# Patient Record
Sex: Male | Born: 1990 | Race: Black or African American | Hispanic: No | Marital: Single | Smoking: Never smoker
Health system: Southern US, Community
[De-identification: ages and names within clinical notes are randomized; demographics above are authoritative.]

## PROBLEM LIST (undated history)

## (undated) DIAGNOSIS — M1990 Unspecified osteoarthritis, unspecified site: Secondary | ICD-10-CM

---
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
## 2017-06-09 ENCOUNTER — Emergency Department (HOSPITAL_COMMUNITY)
Admission: EM | Admit: 2017-06-09 | Discharge: 2017-06-09 | Disposition: A | Discharge status: Home / Self Care | Payer: Medicaid Other | Attending: Emergency Medicine | Admitting: Emergency Medicine

## 2017-06-09 ENCOUNTER — Encounter (HOSPITAL_COMMUNITY): Payer: Self-pay | Admitting: Emergency Medicine

## 2017-06-09 ENCOUNTER — Ambulatory Visit: Payer: Self-pay | Admitting: Physician Assistant

## 2017-06-09 DIAGNOSIS — K0889 Other specified disorders of teeth and supporting structures: Secondary | ICD-10-CM | POA: Insufficient documentation

## 2017-06-09 DIAGNOSIS — Z5321 Procedure and treatment not carried out due to patient leaving prior to being seen by health care provider: Secondary | ICD-10-CM | POA: Insufficient documentation

## 2017-06-09 MED ORDER — IBUPROFEN 400 MG PO TABS
400.0000 mg | ORAL_TABLET | Freq: Once | ORAL | Status: AC | PRN
  Administered 2017-06-09: 400 mg via ORAL

## 2017-06-09 MED ORDER — IBUPROFEN 400 MG PO TABS
ORAL_TABLET | ORAL | Status: AC
  Filled 2017-06-09: qty 1

## 2017-06-09 NOTE — ED Triage Notes (Signed)
Pt presents with dental pain on the L upper jaw with pain that radiates to L face and L ear since yesterday; pt also reports subjective fevers and chills

## 2017-06-09 NOTE — ED Notes (Signed)
Pt approached nurse first stating that he could not wait any longer and would return when he was able to stay longer; pt encouraged to stay but refused; pt ambulatory with steady gait from hospital

## 2017-06-10 ENCOUNTER — Encounter (HOSPITAL_COMMUNITY): Payer: Self-pay | Admitting: Emergency Medicine

## 2017-06-10 ENCOUNTER — Emergency Department (HOSPITAL_COMMUNITY)
Admission: EM | Admit: 2017-06-10 | Discharge: 2017-06-10 | Disposition: A | Discharge status: Home / Self Care | Payer: Self-pay | Attending: Emergency Medicine | Admitting: Emergency Medicine

## 2017-06-10 DIAGNOSIS — H6122 Impacted cerumen, left ear: Secondary | ICD-10-CM | POA: Insufficient documentation

## 2017-06-10 DIAGNOSIS — K0889 Other specified disorders of teeth and supporting structures: Secondary | ICD-10-CM | POA: Insufficient documentation

## 2017-06-10 MED ORDER — HYDROCODONE-ACETAMINOPHEN 5-325 MG PO TABS
1.0000 | ORAL_TABLET | Freq: Once | ORAL | Status: AC
  Administered 2017-06-10: 1 via ORAL
  Filled 2017-06-10: qty 1

## 2017-06-10 MED ORDER — AMOXICILLIN 500 MG PO CAPS: 1000.0000 mg | ORAL_CAPSULE | Freq: Two times a day (BID) | ORAL | 0 refills | Status: DC

## 2017-06-10 MED ORDER — AMOXICILLIN 500 MG PO CAPS
1000.0000 mg | ORAL_CAPSULE | Freq: Once | ORAL | Status: AC
  Administered 2017-06-10: 1000 mg via ORAL
  Filled 2017-06-10: qty 2

## 2017-06-10 MED ORDER — HYDROCODONE-ACETAMINOPHEN 5-325 MG PO TABS: ORAL_TABLET | ORAL | 0 refills | Status: DC

## 2017-06-10 NOTE — ED Notes (Signed)
C/O left upper dental pain and left ear pain x 3 days.

## 2017-06-10 NOTE — ED Triage Notes (Signed)
Pt sts left sided dental pain and ear pain x 3 days

## 2017-06-10 NOTE — ED Provider Notes (Signed)
[facility]-EMERGENCY DEPT Provider Note   CSN: [id number] Arrival date & time: 06/10/17  5465     History   Chief Complaint Chief Complaint  Patient presents with  . Otalgia  . Dental Pain    HPI   Blood pressure 123/76, pulse 68, temperature 98 F (36.7 C), temperature source Oral, resp. rate 18, height 6\' 1"  (1.854 m), weight 99.8 kg (220 lb), SpO2 98 %.  Daniel English is a 26 y.o. male complaining of Left upper dental pain from fractured tooth worsening over the course of last several days not alleviated with acetaminophen. He also reports pain in the left ear, he tried to clean the ear out with Q-tips and full-strength height and peroxide without relief. Denies fever/chills, rhinorrhea, sinus pressure or congestion, difficulty opening jaw, difficulty swallowing, SOB, gum swelling, facial swelling, neck swelling.    History reviewed. No pertinent past medical history.  There are no active problems to display for this patient.   History reviewed. No pertinent surgical history.     Home Medications    Prior to Admission medications   Medication Sig Start Date End Date Taking? Authorizing Provider  amoxicillin (AMOXIL) 500 MG capsule Take 2 capsules (1,000 mg total) by mouth 2 (two) times daily. 06/10/17   Chantele Corado, Joni Reining, PA-C  HYDROcodone-acetaminophen (NORCO/VICODIN) 5-325 MG tablet Take 1-2 tablets by mouth every 6 hours as needed for pain and/or cough. 06/10/17   Hermenegildo Clausen, Mardella Layman    Family History History reviewed. No pertinent family history.  Social History Social History  Substance Use Topics  . Smoking status: Never Smoker  . Smokeless tobacco: Not on file  . Alcohol use No     Comment: social     Allergies   Patient has no known allergies.   Review of Systems Review of Systems  A complete review of systems was obtained and all systems are negative except as noted in the HPI and PMH.    Physical Exam Updated Vital Signs BP 123/76 (BP  Location: Left Arm)   Pulse 68   Temp 98 F (36.7 C) (Oral)   Resp 18   Ht 6\' 1"  (1.854 m)   Wt 99.8 kg (220 lb)   SpO2 98%   BMI 29.03 kg/m   Physical Exam  Constitutional: He is oriented to person, place, and time. He appears well-developed and well-nourished. No distress.  HENT:  Head: Normocephalic.  Mouth/Throat: Oropharynx is clear and moist.  Generally poor dentition, no gingival swelling, erythema or tenderness to palpation. Patient is handling their secretions. There is no tenderness to palpation or firmness underneath tongue bilaterally. No trismus.   Leftsided cerumen impaction, mild irritation to the outer ear canal   Eyes: Pupils are equal, round, and reactive to light. Conjunctivae and EOM are normal.  Neck: Normal range of motion.  Cardiovascular: Normal rate.   Pulmonary/Chest: Effort normal. No stridor.  Abdominal: Soft.  Musculoskeletal: Normal range of motion.  Lymphadenopathy:    He has no cervical adenopathy.  Neurological: He is alert and oriented to person, place, and time.  Psychiatric: He has a normal mood and affect.  Nursing note and vitals reviewed.    ED Treatments / Results  Labs (all labs ordered are listed, but only abnormal results are displayed) Labs Reviewed - No data to display  EKG  EKG Interpretation None       Radiology No results found.  Procedures Procedures (including critical care time)  Medications Ordered in ED Medications  HYDROcodone-acetaminophen (NORCO/VICODIN)  5-325 MG per tablet 1 tablet (1 tablet Oral Given 06/10/17 1057)  amoxicillin (AMOXIL) capsule 1,000 mg (1,000 mg Oral Given 06/10/17 1057)     Initial Impression / Assessment and Plan / ED Course  I have reviewed the triage vital signs and the nursing notes.  Pertinent labs & imaging results that were available during my care of the patient were reviewed by me and considered in my medical decision making (see chart for details).     Vitals:    06/10/17 0926  BP: 123/76  Pulse: 68  Resp: 18  Temp: 98 F (36.7 C)  TempSrc: Oral  SpO2: 98%  Weight: 99.8 kg (220 lb)  Height: 6\' 1"  (1.854 m)    Medications  HYDROcodone-acetaminophen (NORCO/VICODIN) 5-325 MG per tablet 1 tablet (1 tablet Oral Given 06/10/17 1057)  amoxicillin (AMOXIL) capsule 1,000 mg (1,000 mg Oral Given 06/10/17 1057)    Daniel English is 26 y.o. male presenting with dental pain, no overt abscess. Cerumen impaction, RN irrigating.  Did not completely clear impaction, counseled patient on use of D proximal, I doubt this is an otitis media given his lack of fever, rhinorrhea.  Evaluation does not show pathology that would require ongoing emergent intervention or inpatient treatment. Pt is hemodynamically stable and mentating appropriately. Discussed findings and plan with patient/guardian, who agrees with care plan. All questions answered. Return precautions discussed and outpatient follow up given.      Final Clinical Impressions(s) / ED Diagnoses   Final diagnoses:  Pain, dental  Impacted cerumen of left ear    New Prescriptions New Prescriptions   AMOXICILLIN (AMOXIL) 500 MG CAPSULE    Take 2 capsules (1,000 mg total) by mouth 2 (two) times daily.   HYDROCODONE-ACETAMINOPHEN (NORCO/VICODIN) 5-325 MG TABLET    Take 1-2 tablets by mouth every 6 hours as needed for pain and/or cough.     Kaylyn Lim 06/10/17 1200    Jacalyn Lefevre, MD 06/10/17 1536

## 2017-06-10 NOTE — Discharge Instructions (Signed)
Take vicodin for breakthrough pain, do not drink alcohol, drive, care for children or do other critical tasks while taking vicodin.  Use Debrox once a month for 3 days to soften ceruman and prevent future impactions. NEVER use a Q tip in the ear canal

## 2017-06-10 NOTE — ED Notes (Signed)
Tech irrigating left ear with H2O2 and water.

## 2017-07-01 ENCOUNTER — Encounter: Payer: Self-pay | Admitting: Family Medicine

## 2017-07-01 ENCOUNTER — Ambulatory Visit (INDEPENDENT_AMBULATORY_CARE_PROVIDER_SITE_OTHER): Payer: Self-pay | Admitting: Family Medicine

## 2017-07-01 VITALS — BP 110/76 | HR 74 | Temp 98.2°F | Resp 16 | Ht 73.0 in | Wt 229.0 lb

## 2017-07-01 DIAGNOSIS — Z7689 Persons encountering health services in other specified circumstances: Secondary | ICD-10-CM

## 2017-07-01 DIAGNOSIS — Z113 Encounter for screening for infections with a predominantly sexual mode of transmission: Secondary | ICD-10-CM

## 2017-07-01 DIAGNOSIS — Z23 Encounter for immunization: Secondary | ICD-10-CM

## 2017-07-01 DIAGNOSIS — Z0000 Encounter for general adult medical examination without abnormal findings: Secondary | ICD-10-CM

## 2017-07-01 NOTE — Patient Instructions (Signed)
Sexually Transmitted Disease A sexually transmitted disease (STD) is a disease or infection that may be passed (transmitted) from person to person, usually during sexual activity. This may happen by way of saliva, semen, blood, vaginal mucus, or urine. Common STDs include:  Gonorrhea.  Chlamydia.  Syphilis.  HIV and AIDS.  Genital herpes.  Hepatitis B and C.  Trichomonas.  Human papillomavirus (HPV).  Pubic lice.  Scabies.  Mites.  Bacterial vaginosis.  What are the causes? An STD may be caused by bacteria, a virus, or parasites. STDs are often transmitted during sexual activity if one person is infected. However, they may also be transmitted through nonsexual means. STDs may be transmitted after:  Sexual intercourse with an infected person.  Sharing sex toys with an infected person.  Sharing needles with an infected person or using unclean piercing or tattoo needles.  Having intimate contact with the genitals, mouth, or rectal areas of an infected person.  Exposure to infected fluids during birth.  What are the signs or symptoms? Different STDs have different symptoms. Some people may not have any symptoms. If symptoms are present, they may include:  Painful or bloody urination.  Pain in the pelvis, abdomen, vagina, anus, throat, or eyes.  A skin rash, itching, or irritation.  Growths, ulcerations, blisters, or sores in the genital and anal areas.  Abnormal vaginal discharge with or without bad odor.  Penile discharge in men.  Fever.  Pain or bleeding during sexual intercourse.  Swollen glands in the groin area.  Yellow skin and eyes (jaundice). This is seen with hepatitis.  Swollen testicles.  Infertility.  Sores and blisters in the mouth.  How is this diagnosed? To make a diagnosis, your health care provider may:  Take a medical history.  Perform a physical exam.  Take a sample of any discharge to examine.  Swab the throat,  cervix, opening to the penis, rectum, or vagina for testing.  Test a sample of your first morning urine.  Perform blood tests.  Perform a Pap test, if this applies.  Perform a colposcopy.  Perform a laparoscopy.  How is this treated? Treatment depends on the STD. Some STDs may be treated but not cured.  Chlamydia, gonorrhea, trichomonas, and syphilis can be cured with antibiotic medicine.  Genital herpes, hepatitis, and HIV can be treated, but not cured, with prescribed medicines. The medicines lessen symptoms.  Genital warts from HPV can be treated with medicine or by freezing, burning (electrocautery), or surgery. Warts may come back.  HPV cannot be cured with medicine or surgery. However, abnormal areas may be removed from the cervix, vagina, or vulva.  If your diagnosis is confirmed, your recent sexual partners need treatment. This is true even if they are symptom-free or have a negative culture or evaluation. They should not have sex until their health care providers say it is okay.  Your health care provider may test you for infection again 3 months after treatment.  How is this prevented? Take these steps to reduce your risk of getting an STD:  Use latex condoms, dental dams, and water-soluble lubricants during sexual activity. Do not use petroleum jelly or oils.  Avoid having multiple sex partners.  Do not have sex with someone who has other sex partners.  Do not have sex with anyone you do not know or who is at high risk for an STD.  Avoid risky sex practices that can break your skin.  Do not have sex if you have  open sores on your mouth or skin.  Avoid drinking too much alcohol or taking illegal drugs. Alcohol and drugs can affect your judgment and put you in a vulnerable position.  Avoid engaging in oral and anal sex acts.  Get vaccinated for HPV and hepatitis. If you have not received these vaccines in the past, talk to your health care provider about whether  one or both might be right for you.  If you are at risk of being infected with HIV, it is recommended that you take a prescription medicine daily to prevent HIV infection. This is called pre-exposure prophylaxis (PrEP). You are considered at risk if: ? You are a man who has sex with other men (MSM). ? You are a heterosexual man or woman and are sexually active with more than one partner. ? You take drugs by injection. ? You are sexually active with a partner who has HIV.  Talk with your health care provider about whether you are at high risk of being infected with HIV. If you choose to begin PrEP, you should first be tested for HIV. You should then be tested every 3 months for as long as you are taking PrEP.  Contact a health care provider if:  See your health care provider.  Tell your sexual partner(s). They should be tested and treated for any STDs.  Do not have sex until your health care provider says it is okay. Get help right away if: Contact your health care provider right away if:  You have severe abdominal pain.  You are a man and notice swelling or pain in your testicles.  You are a woman and notice swelling or pain in your vagina.  This information is not intended to replace advice given to you by your health care provider. Make sure you discuss any questions you have with your health care provider. Document Released: 12/28/2002 Document Revised: 04/26/2016 Document Reviewed: 04/27/2013 Elsevier Interactive Patient Education  2018 [employer].  Exercising to [employer] Exercising regularly is important. It has many health benefits, such as:  Improving your overall fitness, flexibility, and endurance.  Increasing your bone density.  Helping with weight control.  Decreasing your body fat.  Increasing your muscle strength.  Reducing stress and tension.  Improving your overall health.  In order to become healthy and stay healthy, it is recommended that you do  moderate-intensity and vigorous-intensity exercise. You can tell that you are exercising at a moderate intensity if you have a higher heart rate and faster breathing, but you are still able to hold a conversation. You can tell that you are exercising at a vigorous intensity if you are breathing much harder and faster and cannot hold a conversation while exercising. How often should I exercise? Choose an activity that you enjoy and set realistic goals. Your health care provider can help you to make an activity plan that works for you. Exercise regularly as directed by your health care provider. This may include:  Doing resistance training twice each week, such as: ? Push-ups. ? Sit-ups. ? Lifting weights. ? Using resistance bands.  Doing a given intensity of exercise for a given amount of time. Choose from these options: ? 150 minutes of moderate-intensity exercise every week. ? 75 minutes of vigorous-intensity exercise every week. ? A mix of moderate-intensity and vigorous-intensity exercise every week.  Children, pregnant women, people who are out of shape, people who are overweight, and older adults may need to consult a health care provider for  individual recommendations. If you have any sort of medical condition, be sure to consult your health care provider before starting a new exercise program. What are some exercise ideas? Some moderate-intensity exercise ideas include:  Walking at a rate of 1 mile in 15 minutes.  Biking.  Hiking.  Golfing.  Dancing.  Some vigorous-intensity exercise ideas include:  Walking at a rate of at least 4.5 miles per hour.  Jogging or running at a rate of 5 miles per hour.  Biking at a rate of at least 10 miles per hour.  Lap swimming.  Roller-skating or in-line skating.  Cross-country skiing.  Vigorous competitive sports, such as football, basketball, and soccer.  Jumping rope.  Aerobic dancing.  What are some everyday activities that  can help me to get exercise?  Yard work, such as: ? Pushing a Surveyor, mining. ? Raking and bagging leaves.  Washing and waxing your car.  Pushing a stroller.  Shoveling snow.  Gardening.  Washing windows or floors. How can I be more active in my day-to-day activities?  Use the stairs instead of the elevator.  Take a walk during your lunch break.  If you drive, park your car farther away from work or school.  If you take public transportation, get off one stop early and walk the rest of the way.  Make all of your phone calls while standing up and walking around.  Get up, stretch, and walk around every 30 minutes throughout the day. What guidelines should I follow while exercising?  Do not exercise so much that you hurt yourself, feel dizzy, or get very short of breath.  Consult your health care provider before starting a new exercise program.  Wear comfortable clothes and shoes with good support.  Drink plenty of water while you exercise to prevent dehydration or heat stroke. Body water is lost during exercise and must be replaced.  Work out until you breathe faster and your heart beats faster. This information is not intended to replace advice given to you by your health care provider. Make sure you discuss any questions you have with your health care provider. Document Released: 11/09/2010 Document Revised: 03/14/2016 Document Reviewed: 03/10/2014 Elsevier Interactive Patient Education  [employer].

## 2017-07-01 NOTE — Progress Notes (Signed)
Patient ID: Daniel English, male    DOB: 05/16/1991, 26 y.o.   MRN: [id number]  PCP: Bing NeighborsHarris, Annisha Baar S, FNP  Chief Complaint  Patient presents with  . Establish Care    Subjective:  HPI Daniel English Daniel English is a 26 y.o. male without significant medical history presents today to establish care. He is uninsured and was referred to practice after presenting to the ED for dental pain and caries for which he [facility] antibiotic[facility]. His only complaints today is a request for dental referral for evaluation of dental caries and he requests STD testing. He is sexually active with females and occasionally uses barrier protection. He reports no known STD exposure. Kylian reports 4-7 per days per week and he adheres to low sodium and recently start a reduced "meat diet regimen".  Social History   Social History  . Marital status: Single    Spouse name: N/A  . Number of children: N/A  . Years of education: N/A   Occupational History  . Not on file.   Social History Main Topics  . Smoking status: Never Smoker  . Smokeless tobacco: Never Used  . Alcohol use Yes     Comment: social  . Drug use: No  . Sexual activity: Not on file   Other Topics Concern  . Not on file   Social History Narrative  . No narrative on file    Family History  Problem Relation Age of Onset  . Hypertension Mother    Review of Systems  Constitutional: Negative.   HENT: Positive for dental problem.   Eyes: Negative.   Respiratory: Negative.   Cardiovascular: Negative.   Gastrointestinal: Negative.   Genitourinary: Negative.   Musculoskeletal: Negative.   Allergic/Immunologic: Negative.   Neurological: Negative.   Hematological: Negative.   Psychiatric/Behavioral: Negative.    See HPI  No Known Allergies  Prior to Admission medications   Medication Sig Start Date End Date Taking? Authorizing Provider  amoxicillin (AMOXIL) 500 MG capsule Take 2 capsules  (1,000 mg total) by mouth 2 (two) times daily. Patient not taking: Reported on 07/01/2017 06/10/17   Pisciotta, Joni ReiningNicole, PA-C  HYDROcodone-acetaminophen (NORCO/VICODIN) 5-325 MG tablet Take 1-2 tablets by mouth every 6 hours as needed for pain and/or cough. Patient not taking: Reported on 07/01/2017 06/10/17   Pisciotta, Mardella LaymanNicole, PA-C    Past Medical, Surgical Family and Social History reviewed and updated.    Objective:   Vitals:   07/01/17 0812  BP: 110/76  Pulse: 74  Resp: 16  Temp: 98.2 F (36.8 C)  SpO2: 99%   Wt Readings from Last 3 Encounters:  07/01/17 229 lb (103.9 kg)  06/10/17 220 lb (99.8 kg)  06/09/17 220 lb (99.8 kg)   Physical Exam  Constitutional: He is oriented to person, place, and time. He appears well-developed and well-nourished.  HENT:  Head: Normocephalic and atraumatic.  Right Ear: External ear normal.  Left Ear: External ear normal.  Nose: Nose normal.  Mouth/Throat: Oropharynx is clear and moist.  Eyes: Pupils are equal, round, and reactive to light. Conjunctivae and EOM are normal.  Neck: Normal range of motion. Neck supple. No thyromegaly present.  Cardiovascular: Normal rate, regular rhythm, normal heart sounds and intact distal pulses.   Pulmonary/Chest: Effort normal and breath sounds normal.  Abdominal: Soft. Bowel sounds are normal. He exhibits no distension and no mass. There is no tenderness. There is no rebound and no guarding.  Musculoskeletal: Normal range of  motion.  Lymphadenopathy:    He has no cervical adenopathy.  Neurological: He is alert and oriented to person, place, and time.  Skin: Skin is warm and dry.  Psychiatric: He has a normal mood and affect. His behavior is normal. Judgment and thought content normal.     Assessment & Plan:  1. Encounter to establish care 2. Annual physical exam 3. Routine screening for STI (sexually transmitted infection) 4. Laboratory examination ordered as part of a complete physical examination 5.  Need for influenza vaccination 6. Need for Tdap vaccination   Age appropriate anticipatory guidance provided. Application for orange card and [place] Patient Assistance provided. Once completed and approved for orange card, I will make a referral to the dental clinic.   Orders Placed This Encounter  Procedures  . GC/CT Probe, Amp (Throat)  . C. trachomatis/N. gonorrhoeae RNA  . Flu Vaccine QUAD 36+ mos IM  . Tdap vaccine greater than or equal to 7yo IM  . CBC with Differential  . COMPLETE METABOLIC PANEL WITH GFR  . Lipid panel  . Hemoglobin A1c  . HIV antibody (with reflex)  . HSV(herpes simplex vrs) 1+2 ab-IgG  . RPR    RTC: 12 months CPE or sooner for any acute needs or concerns  Godfrey Pick. Tiburcio Pea, MSN, FNP-C [facility]  [address] [place], [place] [address] [phone]

## 2017-07-02 LAB — COMPLETE METABOLIC PANEL WITH GFR
AG RATIO: 1.1 (calc) (ref 1.0–2.5)
ALBUMIN MSPROF: 4.1 g/dL (ref 3.6–5.1)
ALKALINE PHOSPHATASE (APISO): 77 U/L (ref 40–115)
ALT: 36 U/L (ref 9–46)
AST: 23 U/L (ref 10–40)
BUN / CREAT RATIO: 6 (calc) (ref 6–22)
BUN: 6 mg/dL — ABNORMAL LOW (ref 7–25)
CALCIUM: 9.4 mg/dL (ref 8.6–10.3)
CO2: 21 mmol/L (ref 20–32)
CREATININE: 1.07 mg/dL (ref 0.60–1.35)
Chloride: 103 mmol/L (ref 98–110)
GFR, EST NON AFRICAN AMERICAN: 97 mL/min/{1.73_m2} (ref >=60)
GFR, Est African American: 112 mL/min/{1.73_m2} (ref >=60)
GLOBULIN: 3.6 g/dL (ref 1.9–3.7)
Glucose, Bld: 88 mg/dL (ref 65–99)
Potassium: 4.1 mmol/L (ref 3.5–5.3)
SODIUM: 138 mmol/L (ref 135–146)
Total Bilirubin: 0.7 mg/dL (ref 0.2–1.2)
Total Protein: 7.7 g/dL (ref 6.1–8.1)

## 2017-07-02 LAB — CBC WITH DIFFERENTIAL/PLATELET
BASOS ABS: 40 {cells}/uL (ref 0–200)
Basophils Relative: 1.1 %
EOS PCT: 1.1 %
Eosinophils Absolute: 40 cells/uL (ref 15–500)
HCT: 48.7 % (ref 38.5–50.0)
HEMOGLOBIN: 16.2 g/dL (ref 13.2–17.1)
LYMPHS ABS: 1969 {cells}/uL (ref 850–3900)
MCH: 26.9 pg — ABNORMAL LOW (ref 27.0–33.0)
MCHC: 33.3 g/dL (ref 32.0–36.0)
MCV: 80.9 fL (ref 80.0–100.0)
MONOS PCT: 9.5 %
MPV: 10.4 fL (ref 7.5–12.5)
NEUTROS ABS: 1210 {cells}/uL — AB (ref 1500–7800)
NEUTROS PCT: 33.6 %
Platelets: 300 10*3/uL (ref 140–400)
RBC: 6.02 10*6/uL — AB (ref 4.20–5.80)
RDW: 12.5 % (ref 11.0–15.0)
Total Lymphocyte: 54.7 %
WBC mixed population: 342 cells/uL (ref 200–950)
WBC: 3.6 10*3/uL — AB (ref 3.8–10.8)

## 2017-07-02 LAB — RPR: RPR Ser Ql: NONREACTIVE

## 2017-07-02 LAB — LIPID PANEL
CHOL/HDL RATIO: 4.4 (calc) (ref <5.0)
Cholesterol: 150 mg/dL (ref <200)
HDL: 34 mg/dL — ABNORMAL LOW (ref >40)
LDL Cholesterol (Calc): 101 mg/dL (calc) — ABNORMAL HIGH
NON-HDL CHOLESTEROL (CALC): 116 mg/dL (ref <130)
Triglycerides: 60 mg/dL (ref <150)

## 2017-07-02 LAB — HSV(HERPES SIMPLEX VRS) I + II AB-IGG
HAV 1 IGG,TYPE SPECIFIC AB: 0.95 index — ABNORMAL HIGH
HSV 2 IGG,TYPE SPECIFIC AB: <0.9 index

## 2017-07-02 LAB — HEMOGLOBIN A1C
EAG (MMOL/L): 5.5 (calc)
Hgb A1c MFr Bld: 5.1 % of total Hgb (ref <5.7)
MEAN PLASMA GLUCOSE: 100 (calc)

## 2017-07-02 LAB — HIV ANTIBODY (ROUTINE TESTING W REFLEX): HIV 1&2 Ab, 4th Generation: NONREACTIVE

## 2017-07-03 LAB — C. TRACHOMATIS/N. GONORRHOEAE RNA
C. trachomatis RNA, TMA: NOT DETECTED
N. GONORRHOEAE RNA, TMA: NOT DETECTED

## 2017-07-03 LAB — GC/CHLAMYDIA PROBE, AMP (THROAT)

## 2017-07-09 ENCOUNTER — Encounter: Payer: Self-pay | Admitting: Family Medicine

## 2017-07-09 ENCOUNTER — Ambulatory Visit: Payer: Self-pay | Attending: Internal Medicine

## 2018-12-01 ENCOUNTER — Ambulatory Visit (HOSPITAL_COMMUNITY)
Admission: EM | Admit: 2018-12-01 | Discharge: 2018-12-01 | Disposition: A | Discharge status: Home / Self Care | Payer: Self-pay | Attending: Family Medicine | Admitting: Family Medicine

## 2018-12-01 ENCOUNTER — Other Ambulatory Visit: Payer: Self-pay

## 2018-12-01 ENCOUNTER — Encounter (HOSPITAL_COMMUNITY): Payer: Self-pay

## 2018-12-01 DIAGNOSIS — B349 Viral infection, unspecified: Secondary | ICD-10-CM

## 2018-12-01 MED ORDER — FLUTICASONE PROPIONATE 50 MCG/ACT NA SUSP: 2.0000 | Freq: Every day | NASAL | 0 refills | Status: DC

## 2018-12-01 MED ORDER — IPRATROPIUM BROMIDE 0.06 % NA SOLN: 2.0000 | Freq: Four times a day (QID) | NASAL | 0 refills | Status: DC

## 2018-12-01 NOTE — ED Provider Notes (Signed)
[facility]    CSN: [id number] Arrival date & time: 12/01/18  1539     History   Chief Complaint Chief Complaint  Patient presents with  . stretchy sore    HPI Henrene PastorDiquan O Welp is a 28 y.o. male.   28 year old male comes in for 1 day history of URI symptoms.  Has had rhinorrhea, nasal congestion, throat irritation.  Denies cough.  Denies fever, chills, night sweats.  Denies painful swallowing, trouble breathing, swelling of the throat, tripoding, drooling, trismus.  Denies nausea, vomiting, diarrhea, abdominal pain.  OTC cold medication with mild relief.  States has multiple sick contacts at work.  Never tobacco smoker.  Occasional THC use.     History reviewed. No pertinent past medical history.  There are no active problems to display for this patient.   History reviewed. No pertinent surgical history.     Home Medications    Prior to Admission medications   Medication Sig Start Date End Date Taking? Authorizing Provider  fluticasone (FLONASE) 50 MCG/ACT nasal spray Place 2 sprays into both nostrils daily. 12/01/18   Cathie HoopsYu, Navneet Schmuck V, PA-C  ipratropium (ATROVENT) 0.06 % nasal spray Place 2 sprays into both nostrils 4 (four) times daily. 12/01/18   Belinda FisherYu, Dimitry Holsworth V, PA-C    Family History Family History  Problem Relation Age of Onset  . Hypertension Mother     Social History Social History   Tobacco Use  . Smoking status: Never Smoker  . Smokeless tobacco: Never Used  Substance Use Topics  . Alcohol use: Yes    Comment: social  . Drug use: No     Allergies   Patient has no known allergies.   Review of Systems Review of Systems  Reason unable to perform ROS: See HPI as above.     Physical Exam Triage Vital Signs ED Triage Vitals  Enc Vitals Group     BP 12/01/18 1600 115/67     Pulse Rate 12/01/18 1600 83     Resp 12/01/18 1600 18     Temp 12/01/18 1600 98.7 F (37.1 C)     Temp Source 12/01/18 1600 Oral     SpO2 12/01/18 1600 100 %   Weight 12/01/18 1602 200 lb (90.7 kg)     Height --      Head Circumference --      Peak Flow --      Pain Score --      Pain Loc --      Pain Edu? --      Excl. in [place]? --    No data found.  Updated Vital Signs BP 115/67 (BP Location: Right Arm)   Pulse 83   Temp 98.7 F (37.1 C) (Oral)   Resp 18   Wt 200 lb (90.7 kg)   SpO2 100%   BMI 26.39 kg/m   Physical Exam Constitutional:      General: He is not in acute distress.    Appearance: He is well-developed. He is not toxic-appearing.  HENT:     Head: Normocephalic and atraumatic.     Right Ear: Tympanic membrane, ear canal and external ear normal. Tympanic membrane is not erythematous or bulging.     Left Ear: Tympanic membrane, ear canal and external ear normal. Tympanic membrane is not erythematous or bulging.     Nose: Rhinorrhea present.     Right Sinus: No maxillary sinus tenderness or frontal sinus tenderness.     Left Sinus: No maxillary sinus  tenderness or frontal sinus tenderness.     Mouth/Throat:     Mouth: Mucous membranes are moist.     Pharynx: Oropharynx is clear. Uvula midline.  Eyes:     Conjunctiva/sclera: Conjunctivae normal.     Pupils: Pupils are equal, round, and reactive to light.  Neck:     Musculoskeletal: Normal range of motion and neck supple.  Cardiovascular:     Rate and Rhythm: Normal rate and regular rhythm.     Heart sounds: Normal heart sounds. No murmur. No friction rub. No gallop.   Pulmonary:     Effort: Pulmonary effort is normal.     Breath sounds: Normal breath sounds. No decreased breath sounds, wheezing, rhonchi or rales.  Skin:    General: Skin is warm and dry.  Neurological:     Mental Status: He is alert and oriented to person, place, and time.  Psychiatric:        Behavior: Behavior normal.        Judgment: Judgment normal.      UC Treatments / Results  Labs (all labs ordered are listed, but only abnormal results are displayed) Labs Reviewed - No data to  display  EKG None  Radiology No results found.  Procedures Procedures (including critical care time)  Medications Ordered in UC Medications - No data to display  Initial Impression / Assessment and Plan / UC Course  I have reviewed the triage vital signs and the nursing notes.  Pertinent labs & imaging results that were available during my care of the patient were reviewed by me and considered in my medical decision making (see chart for details).    Discussed with patient history and exam most consistent with viral URI. Symptomatic treatment as needed. Push fluids. Return precautions given.   Final Clinical Impressions(s) / UC Diagnoses   Final diagnoses:  Viral illness    ED Prescriptions    Medication Sig Dispense Auth. Provider   ipratropium (ATROVENT) 0.06 % nasal spray Place 2 sprays into both nostrils 4 (four) times daily. 15 mL Branson Kranz V, PA-C   fluticasone (FLONASE) 50 MCG/ACT nasal spray Place 2 sprays into both nostrils daily. 1 g Threasa Alpha, [place] 12/01/18 1645

## 2018-12-01 NOTE — Discharge Instructions (Signed)
Start flonase, atrovent nasal spray for nasal congestion/drainage. You can use over the counter nasal saline rinse such as neti pot for nasal congestion. Keep hydrated, your urine should be clear to pale yellow in color. Tylenol/motrin for fever and pain. Monitor for any worsening of symptoms, chest pain, shortness of breath, wheezing, swelling of the throat, follow up for reevaluation.  ° °For sore throat/cough try using a honey-based tea. Use 3 teaspoons of honey with juice squeezed from half lemon. Place shaved pieces of ginger into 1/2-1 cup of water and warm over stove top. Then mix the ingredients and repeat every 4 hours as needed. ° °

## 2018-12-01 NOTE — ED Triage Notes (Signed)
Pt cc stretchy throat and runny nose this started last night.

## 2020-02-13 ENCOUNTER — Ambulatory Visit (INDEPENDENT_AMBULATORY_CARE_PROVIDER_SITE_OTHER): Payer: Self-pay

## 2020-02-13 ENCOUNTER — Ambulatory Visit
Admission: EM | Admit: 2020-02-13 | Discharge: 2020-02-13 | Disposition: A | Discharge status: Home / Self Care | Payer: Self-pay | Attending: Emergency Medicine | Admitting: Emergency Medicine

## 2020-02-13 ENCOUNTER — Encounter: Payer: Self-pay | Admitting: Emergency Medicine

## 2020-02-13 ENCOUNTER — Other Ambulatory Visit: Payer: Self-pay

## 2020-02-13 DIAGNOSIS — M25532 Pain in left wrist: Secondary | ICD-10-CM

## 2020-02-13 DIAGNOSIS — S00531A Contusion of lip, initial encounter: Secondary | ICD-10-CM

## 2020-02-13 DIAGNOSIS — W503XXA Accidental bite by another person, initial encounter: Secondary | ICD-10-CM

## 2020-02-13 MED ORDER — AMOXICILLIN-POT CLAVULANATE 875-125 MG PO TABS: 1.0000 | ORAL_TABLET | Freq: Two times a day (BID) | ORAL | 0 refills | Status: DC

## 2020-02-13 NOTE — Discharge Instructions (Addendum)
Take antibiotic twice daily with food.  Recommend RICE: rest, ice, compression, elevation as needed for pain.   Cold therapy (ice packs) can be used to help swelling both after injury and after prolonged use of areas of chronic pain/aches.  For pain: recommend 350 mg-1000 mg of Tylenol (acetaminophen) and/or 200 mg - 800 mg of Advil (ibuprofen, Motrin) every 8 hours as needed.  May alternate between the two throughout the day as they are generally safe to take together.  DO NOT exceed more than 3000 mg of Tylenol or 3200 mg of ibuprofen in a 24 hour period as this could damage your stomach, kidneys, liver, or increase your bleeding risk.
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
## 2020-05-18 ENCOUNTER — Ambulatory Visit
Admission: EM | Admit: 2020-05-18 | Discharge: 2020-05-18 | Disposition: A | Discharge status: Home / Self Care | Payer: Self-pay | Attending: Physician Assistant | Admitting: Physician Assistant

## 2020-05-18 ENCOUNTER — Encounter: Payer: Self-pay | Admitting: Emergency Medicine

## 2020-05-18 ENCOUNTER — Other Ambulatory Visit: Payer: Self-pay

## 2020-05-18 DIAGNOSIS — R197 Diarrhea, unspecified: Secondary | ICD-10-CM

## 2020-05-18 DIAGNOSIS — R112 Nausea with vomiting, unspecified: Secondary | ICD-10-CM

## 2020-05-18 MED ORDER — ONDANSETRON 4 MG PO TBDP: 4.0000 mg | ORAL_TABLET | Freq: Three times a day (TID) | ORAL | 0 refills | Status: DC | PRN

## 2020-05-18 MED ORDER — DICYCLOMINE HCL 20 MG PO TABS: 20.0000 mg | ORAL_TABLET | Freq: Two times a day (BID) | ORAL | 0 refills | Status: DC | PRN

## 2020-05-18 NOTE — ED Notes (Signed)
Patient able to ambulate independently  

## 2020-05-18 NOTE — ED Triage Notes (Signed)
Pt presents to [facility] for assessment of generalized abdominal pain and diarrhea starting yesterday.  States he ate some leftover seafood which may have caused it.  States he had to miss work yesterday for symptoms.

## 2020-05-18 NOTE — ED Provider Notes (Signed)
[facility]    CSN: [id number] Arrival date & time: 05/18/20  1014      History   Chief Complaint Chief Complaint  Patient presents with  . Abdominal Pain  . Diarrhea    HPI Daniel English is a 29 y.o. male.   29 year old male comes in for 2 day history of nausea, vomiting, diarrhea. Has had 10+ episodes of diarrhea. Denies melena, hematochezia. Nausea with 3-4 NBNB vomiting. Denies fever, URI symptoms. Tried to eat food, that caused the diarrhea to be worse.      History reviewed. No pertinent past medical history.  There are no problems to display for this patient.   History reviewed. No pertinent surgical history.     Home Medications    Prior to Admission medications   Medication Sig Start Date End Date Taking? Authorizing Provider  dicyclomine (BENTYL) 20 MG tablet Take 1 tablet (20 mg total) by mouth 2 (two) times daily as needed for spasms. 05/18/20   Cathie Hoops, Cyrilla Durkin V, PA-C  ondansetron (ZOFRAN ODT) 4 MG disintegrating tablet Take 1 tablet (4 mg total) by mouth every 8 (eight) hours as needed for nausea or vomiting. 05/18/20   Cathie Hoops, Molly Maselli V, PA-C  fluticasone (FLONASE) 50 MCG/ACT nasal spray Place 2 sprays into both nostrils daily. 12/01/18 05/18/20  Cathie Hoops, Corrigan Kretschmer V, PA-C  ipratropium (ATROVENT) 0.06 % nasal spray Place 2 sprays into both nostrils 4 (four) times daily. 12/01/18 05/18/20  Belinda Fisher, PA-C    Family History Family History  Problem Relation Age of Onset  . Hypertension Mother     Social History Social History   Tobacco Use  . Smoking status: Never Smoker  . Smokeless tobacco: Never Used  Substance Use Topics  . Alcohol use: Yes    Comment: social  . Drug use: No     Allergies   Patient has no known allergies.   Review of Systems Review of Systems  Reason unable to perform ROS: See HPI as above.     Physical Exam Triage Vital Signs ED Triage Vitals [05/18/20 1020]  Enc Vitals Group     BP (!) 110/64     Pulse Rate 66     Resp  18     Temp 98 F (36.7 C)     Temp Source Oral     SpO2 98 %     Weight      Height      Head Circumference      Peak Flow      Pain Score 5     Pain Loc      Pain Edu?      Excl. in [place]?    No data found.  Updated Vital Signs BP (!) 110/64 (BP Location: Right Arm)   Pulse 66   Temp 98 F (36.7 C) (Oral)   Resp 18   SpO2 98%   Physical Exam Constitutional:      General: He is not in acute distress.    Appearance: Normal appearance. He is not ill-appearing, toxic-appearing or diaphoretic.  HENT:     Head: Normocephalic and atraumatic.  Cardiovascular:     Rate and Rhythm: Normal rate and regular rhythm.  Pulmonary:     Effort: Pulmonary effort is normal. No respiratory distress.     Comments: LCTAB Abdominal:     General: Bowel sounds are normal.     Palpations: Abdomen is soft.     Tenderness: There is no abdominal  tenderness. There is no right CVA tenderness, left CVA tenderness, guarding or rebound.  Musculoskeletal:     Cervical back: Normal range of motion and neck supple.  Skin:    General: Skin is warm and dry.  Neurological:     Mental Status: He is alert and oriented to person, place, and time.      UC Treatments / Results  Labs (all labs ordered are listed, but only abnormal results are displayed) Labs Reviewed - No data to display  EKG   Radiology No results found.  Procedures Procedures (including critical care time)  Medications Ordered in UC Medications - No data to display  Initial Impression / Assessment and Plan / UC Course  I have reviewed the triage vital signs and the nursing notes.  Pertinent labs & imaging results that were available during my care of the patient were reviewed by me and considered in my medical decision making (see chart for details).    Discussed with patient no alarming signs on exam. Zofran for nausea. Bentyl for abdominal cramping. Push fluids. Bland diet, advance as tolerated. Return precautions  given.  Final Clinical Impressions(s) / UC Diagnoses   Final diagnoses:  Nausea vomiting and diarrhea    ED Prescriptions    Medication Sig Dispense Auth. Provider   ondansetron (ZOFRAN ODT) 4 MG disintegrating tablet Take 1 tablet (4 mg total) by mouth every 8 (eight) hours as needed for nausea or vomiting. 20 tablet Orianna Biskup V, PA-C   dicyclomine (BENTYL) 20 MG tablet Take 1 tablet (20 mg total) by mouth 2 (two) times daily as needed for spasms. 20 tablet Belinda Fisher, PA-C     PDMP not reviewed this encounter.   Belinda Fisher, PA-C 05/18/20 1042

## 2020-05-18 NOTE — Discharge Instructions (Signed)
Zofran for nausea and vomiting as needed. Bentyl for abdominal cramping. Keep hydrated, you urine should be clear to pale yellow in color. Bland diet, advance as tolerated. Monitor for any worsening of symptoms, nausea or vomiting not controlled by medication, worsening abdominal pain, fever, go to the emergency department for further evaluation needed.  °

## 2021-03-12 ENCOUNTER — Other Ambulatory Visit: Payer: Self-pay

## 2021-03-12 ENCOUNTER — Other Ambulatory Visit (HOSPITAL_COMMUNITY): Payer: Self-pay

## 2021-03-12 ENCOUNTER — Emergency Department (HOSPITAL_COMMUNITY)
Admission: EM | Admit: 2021-03-12 | Discharge: 2021-03-12 | Disposition: A | Discharge status: Home / Self Care | Payer: 59 | Attending: Emergency Medicine | Admitting: Emergency Medicine

## 2021-03-12 DIAGNOSIS — K0889 Other specified disorders of teeth and supporting structures: Secondary | ICD-10-CM | POA: Diagnosis present

## 2021-03-12 DIAGNOSIS — K047 Periapical abscess without sinus: Secondary | ICD-10-CM | POA: Diagnosis not present

## 2021-03-12 MED ORDER — METRONIDAZOLE 500 MG PO TABS
500.0000 mg | ORAL_TABLET | Freq: Two times a day (BID) | ORAL | 0 refills | Status: DC
  Filled 2021-03-12: qty 14, 7d supply, fill #0

## 2021-03-12 MED ORDER — PENICILLIN V POTASSIUM 500 MG PO TABS
500.0000 mg | ORAL_TABLET | Freq: Once | ORAL | Status: AC
  Administered 2021-03-12: 500 mg via ORAL
  Filled 2021-03-12: qty 1

## 2021-03-12 MED ORDER — PENICILLIN V POTASSIUM 500 MG PO TABS
500.0000 mg | ORAL_TABLET | Freq: Four times a day (QID) | ORAL | 0 refills | Status: AC
  Filled 2021-03-12: qty 28, 7d supply, fill #0

## 2021-03-12 MED ORDER — NAPROXEN 500 MG PO TABS
500.0000 mg | ORAL_TABLET | Freq: Once | ORAL | Status: AC
  Administered 2021-03-12: 500 mg via ORAL
  Filled 2021-03-12: qty 1

## 2021-03-12 MED ORDER — METRONIDAZOLE 500 MG PO TABS
500.0000 mg | ORAL_TABLET | Freq: Once | ORAL | Status: AC
  Administered 2021-03-12: 500 mg via ORAL
  Filled 2021-03-12: qty 1

## 2021-03-12 NOTE — ED Triage Notes (Signed)
Pt c/o R upper dental pain. States on Saturday he began having increased pain that is now radiating to R ear. Scheduled to have wisdom teeth out next month. Denies fevers. Tried ibuprofen and orajel at home with minimal relief.

## 2021-03-12 NOTE — ED Provider Notes (Signed)
[facility]-EMERGENCY DEPT Provider Note   CSN: [id number] Arrival date & time: 03/12/21  0550     History Chief Complaint  Patient presents with  . Dental Pain    Daniel English is a 30 y.o. male.  HPI     30 year old male comes in with chief complaint of dental pain.  He reports that 2 days ago he chipped his tooth.  There was some pain, he applied Orajel and has been taking ibuprofen.  This morning he woke up with swelling to his face.  He denies any fevers or chills but does have pain and feels like there could be underlying infection.  Patient has a Education officer, community.  He denies any recent abscesses.  He does indicate that his extra molars have not been removed, and he suspects that might be contributing  No past medical history on file.  There are no problems to display for this patient.   No past surgical history on file.     Family History  Problem Relation Age of Onset  . Hypertension Mother     Social History   Tobacco Use  . Smoking status: Never Smoker  . Smokeless tobacco: Never Used  Substance Use Topics  . Alcohol use: Yes    Comment: social  . Drug use: No    Home Medications Prior to Admission medications   Medication Sig Start Date End Date Taking? Authorizing Provider  metroNIDAZOLE (FLAGYL) 500 MG tablet Take 1 tablet (500 mg total) by mouth 2 (two) times daily. No alcohol. 03/12/21  Yes Derwood Kaplan, MD  penicillin v potassium (VEETID) 500 MG tablet Take 1 tablet (500 mg total) by mouth 4 (four) times daily for 7 days. 03/12/21 03/19/21 Yes Derwood Kaplan, MD  dicyclomine (BENTYL) 20 MG tablet Take 1 tablet (20 mg total) by mouth 2 (two) times daily as needed for spasms. 05/18/20   Cathie Hoops, Amy V, PA-C  ondansetron (ZOFRAN ODT) 4 MG disintegrating tablet Take 1 tablet (4 mg total) by mouth every 8 (eight) hours as needed for nausea or vomiting. 05/18/20   Cathie Hoops, Amy V, PA-C  fluticasone (FLONASE) 50 MCG/ACT nasal spray Place 2 sprays into  both nostrils daily. 12/01/18 05/18/20  Cathie Hoops, Amy V, PA-C  ipratropium (ATROVENT) 0.06 % nasal spray Place 2 sprays into both nostrils 4 (four) times daily. 12/01/18 05/18/20  Belinda Fisher, PA-C    Allergies    Patient has no known allergies.  Review of Systems   Review of Systems  Constitutional: Positive for activity change. Negative for fever.  HENT: Positive for dental problem and facial swelling. Negative for drooling, hearing loss and trouble swallowing.   Gastrointestinal: Negative for nausea and vomiting.  Allergic/Immunologic: Negative for immunocompromised state.  Neurological: Negative for headaches.    Physical Exam Updated Vital Signs BP 128/88   Pulse 88   Temp 98 F (36.7 C)   Resp 18   Ht 6' (1.829 m)   Wt 95.3 kg   SpO2 100%   BMI 28.48 kg/m   Physical Exam Vitals and nursing note reviewed.  Constitutional:      Appearance: He is well-developed.  HENT:     Head: Atraumatic.     Mouth/Throat:     Comments: Pt has no trismus, stridor or crepitus over the neck. Pt has bilateral tonsillar enlargement w/o exudates. Pharynx is erythematous. Multiple teeth have decay. Gingival tenderness around tooth 1 and 2.   Cardiovascular:     Rate and Rhythm: Normal  rate.  Pulmonary:     Effort: Pulmonary effort is normal.  Musculoskeletal:     Cervical back: Neck supple.  Skin:    General: Skin is warm.  Neurological:     Mental Status: He is alert and oriented to person, place, and time.     ED Results / Procedures / Treatments   Labs (all labs ordered are listed, but only abnormal results are displayed) Labs Reviewed - No data to display  EKG None  Radiology No results found.  Procedures Procedures   Medications Ordered in ED Medications  penicillin v potassium (VEETID) tablet 500 mg (500 mg Oral Given 03/12/21 0751)  metroNIDAZOLE (FLAGYL) tablet 500 mg (500 mg Oral Given 03/12/21 0750)  naproxen (NAPROSYN) tablet 500 mg (500 mg Oral Given 03/12/21 0751)     ED Course  I have reviewed the triage vital signs and the nursing notes.  Pertinent labs & imaging results that were available during my care of the patient were reviewed by me and considered in my medical decision making (see chart for details).    MDM Rules/Calculators/A&P                          DDx includes: - Periapical tooth infection - Dental abscess - Gingivitis - Dental trauma - Pulpitis - Nerve root compression  30 year old comes in a chief complaint of tooth pain.  He has gingival tenderness over tooth 1 and 2.  He has multiple teeth that have Decay/erosion.  There is right-sided facial swelling without any hard signs of deep space infection.  Patient is nontoxic.  Suspect it to be periapical abscess. Will treat with antibiotics with strict ER return precautions that have been discussed with the patient.  He will follow-up with his own dentist or the dentist on-call, whose information has been provided.  Final Clinical Impression(s) / ED Diagnoses Final diagnoses:  Dental infection    Rx / DC Orders ED Discharge Orders         Ordered    penicillin v potassium (VEETID) 500 MG tablet  4 times daily        03/12/21 0815    metroNIDAZOLE (FLAGYL) 500 MG tablet  2 times daily        03/12/21 0815           Derwood Kaplan, MD 03/12/21 [phone]

## 2021-03-12 NOTE — Discharge Instructions (Signed)
We saw you in the ER for the toothache. No evidence of deep infection, no evidence of pus that can be drained out. YOU WILL NEED TO SEE A DENTIST to get appropriate care. Please take the antibiotics and pain meds provided in the interval.  See your Dentist or call one of the dentist on call for a follow up in 5-7 days.

## 2021-03-14 ENCOUNTER — Encounter (HOSPITAL_COMMUNITY): Payer: Self-pay | Admitting: *Deleted

## 2021-03-14 ENCOUNTER — Emergency Department (HOSPITAL_COMMUNITY)
Admission: EM | Admit: 2021-03-14 | Discharge: 2021-03-14 | Disposition: A | Discharge status: Home / Self Care | Payer: 59 | Attending: Emergency Medicine | Admitting: Emergency Medicine

## 2021-03-14 DIAGNOSIS — K0889 Other specified disorders of teeth and supporting structures: Secondary | ICD-10-CM | POA: Insufficient documentation

## 2021-03-14 NOTE — ED Triage Notes (Signed)
Pt was recently seen for dental pain, he is taking his prescribed medication but states he cannot work d/t pain and would like a work note to rest.

## 2021-03-14 NOTE — ED Provider Notes (Signed)
[facility]-EMERGENCY DEPT Provider Note   CSN: [id number] Arrival date & time: 03/14/21  0848     History Chief Complaint  Patient presents with  . needs work note    Daniel English is a 30 y.o. male.  HPI Patient here for work note for dental pain.  He states that he was seen 2 days ago and is scheduled to follow-up with his dentist to have wisdom teeth removed.  He denies any fevers nausea vomiting chills.  He states that he has not been taking very much pain medicine he is has been taking the penicillin prescribed by his dentist.  He states that yesterday while he was at work his pain became severe enough that it prompted him to not go to work today    History reviewed. No pertinent past medical history.  There are no problems to display for this patient.   History reviewed. No pertinent surgical history.     Family History  Problem Relation Age of Onset  . Hypertension Mother     Social History   Tobacco Use  . Smoking status: Never Smoker  . Smokeless tobacco: Never Used  Substance Use Topics  . Alcohol use: Yes    Comment: social  . Drug use: No    Home Medications Prior to Admission medications   Medication Sig Start Date End Date Taking? Authorizing Provider  dicyclomine (BENTYL) 20 MG tablet Take 1 tablet (20 mg total) by mouth 2 (two) times daily as needed for spasms. 05/18/20   Cathie Hoops, Amy V, PA-C  metroNIDAZOLE (FLAGYL) 500 MG tablet Take 1 tablet (500 mg total) by mouth 2 (two) times daily. No alcohol. 03/12/21   Derwood Kaplan, MD  ondansetron (ZOFRAN ODT) 4 MG disintegrating tablet Take 1 tablet (4 mg total) by mouth every 8 (eight) hours as needed for nausea or vomiting. 05/18/20   Cathie Hoops, Amy V, PA-C  penicillin v potassium (VEETID) 500 MG tablet Take 1 tablet (500 mg total) by mouth 4 (four) times daily for 7 days. 03/12/21 03/19/21  Derwood Kaplan, MD  fluticasone (FLONASE) 50 MCG/ACT nasal spray Place 2 sprays into both nostrils  daily. 12/01/18 05/18/20  Cathie Hoops, Amy V, PA-C  ipratropium (ATROVENT) 0.06 % nasal spray Place 2 sprays into both nostrils 4 (four) times daily. 12/01/18 05/18/20  Belinda Fisher, PA-C    Allergies    Patient has no known allergies.  Review of Systems   Review of Systems  Constitutional: Negative for chills and fever.  HENT: Positive for dental problem. Negative for congestion.   Respiratory: Negative for shortness of breath.   Cardiovascular: Negative for chest pain.  Gastrointestinal: Negative for abdominal pain.  Musculoskeletal: Negative for neck pain.    Physical Exam Updated Vital Signs BP 113/63 (BP Location: Right Arm)   Pulse 89   Temp 98.1 F (36.7 C) (Oral)   Resp 18   SpO2 98%   Physical Exam Vitals and nursing note reviewed.  Constitutional:      General: He is not in acute distress.    Appearance: Normal appearance. He is not ill-appearing.  HENT:     Head: Normocephalic and atraumatic.     Mouth/Throat:     Comments: No trismus. No swelling or redness of gumline.  Full range of motion of tongue No sublingual tenderness palpation Eyes:     General: No scleral icterus.       Right eye: No discharge.        Left  eye: No discharge.     Conjunctiva/sclera: Conjunctivae normal.  Pulmonary:     Effort: Pulmonary effort is normal.     Breath sounds: No stridor.  Neurological:     Mental Status: He is alert and oriented to person, place, and time. Mental status is at baseline.     ED Results / Procedures / Treatments   Labs (all labs ordered are listed, but only abnormal results are displayed) Labs Reviewed - No data to display  EKG None  Radiology No results found.  Procedures Procedures   Medications Ordered in ED Medications - No data to display  ED Course  I have reviewed the triage vital signs and the nursing notes.  Pertinent labs & imaging results that were available during my care of the patient were reviewed by me and considered in my medical  decision making (see chart for details).    MDM Rules/Calculators/A&P                          Patient is well-appearing 30 year old male here for dental pain he has been seen previously he is following up with dentist has good follow-up is currently on penicillin.  He has not taken Tylenol or ibuprofen for pain control and we had a discussion about appropriate management of this pain with appropriate doses of Tylenol and ibuprofen.  No trismus no evidence of deep space infection.  Patient given work note.  Final Clinical Impression(s) / ED Diagnoses Final diagnoses:  Pain, dental    Rx / DC Orders ED Discharge Orders    None       Gailen Shelter, [place] 03/14/21 1001    Gwyneth Sprout, MD 03/14/21 1018

## 2021-09-28 ENCOUNTER — Ambulatory Visit (HOSPITAL_COMMUNITY)
Admission: EM | Admit: 2021-09-28 | Discharge: 2021-09-28 | Disposition: A | Discharge status: Home / Self Care | Payer: 59

## 2021-09-28 ENCOUNTER — Encounter (HOSPITAL_COMMUNITY): Payer: Self-pay

## 2021-09-28 ENCOUNTER — Other Ambulatory Visit: Payer: Self-pay

## 2021-09-28 DIAGNOSIS — M25561 Pain in right knee: Secondary | ICD-10-CM

## 2021-09-28 NOTE — Discharge Instructions (Signed)
You can use ibuprofen 800mg  three times a day if needed for pain. You might also try icing it.
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
## 2021-09-28 NOTE — ED Provider Notes (Signed)
[facility]    CSN: [id number] Arrival date & time: 09/28/21  1938      History   Chief Complaint Chief Complaint  Patient presents with   Knee Pain    HPI Daniel English is a 30 y.o. male.  Patient works at the [employer].  Today a piece of equipment hit him in the medial right knee when he was in a squatting position.  Reports mild pain.  Hurts to walk but is able to walk.  Has not done anything to treat his symptoms.   Knee Pain  History reviewed. No pertinent past medical history.  There are no problems to display for this patient.   History reviewed. No pertinent surgical history.     Home Medications    Prior to Admission medications   Medication Sig Start Date End Date Taking? Authorizing Provider  fluticasone (FLONASE) 50 MCG/ACT nasal spray Place 2 sprays into both nostrils daily. 12/01/18 05/18/20  Cathie Hoops, Amy V, PA-C  ipratropium (ATROVENT) 0.06 % nasal spray Place 2 sprays into both nostrils 4 (four) times daily. 12/01/18 05/18/20  Belinda Fisher, PA-C    Family History Family History  Problem Relation Age of Onset   Hypertension Mother     Social History Social History   Tobacco Use   Smoking status: Never   Smokeless tobacco: Never  Substance Use Topics   Alcohol use: Yes    Comment: social   Drug use: No     Allergies   Patient has no known allergies.   Review of Systems Review of Systems   Physical Exam Triage Vital Signs ED Triage Vitals  Enc Vitals Group     BP 09/28/21 1950 123/74     Pulse Rate 09/28/21 1950 87     Resp 09/28/21 1950 18     Temp 09/28/21 1950 98.5 F (36.9 C)     Temp Source 09/28/21 1950 Oral     SpO2 09/28/21 1950 96 %     Weight --      Height --      Head Circumference --      Peak Flow --      Pain Score 09/28/21 1951 5     Pain Loc --      Pain Edu? --      Excl. in [place]? --    No data found.  Updated Vital Signs BP 123/74 (BP Location: Left Arm)   Pulse 87   Temp 98.5 F (36.9 C)  (Oral)   Resp 18   SpO2 96%   Visual Acuity Right Eye Distance:   Left Eye Distance:   Bilateral Distance:    Right Eye Near:   Left Eye Near:    Bilateral Near:     Physical Exam Constitutional:      Appearance: Normal appearance.  Musculoskeletal:     Right knee: Bony tenderness present. No swelling or deformity. Normal range of motion. Tenderness present. No LCL laxity, MCL laxity, ACL laxity or PCL laxity. Normal patellar mobility.  Neurological:     Mental Status: He is alert.     Gait: Gait normal.     UC Treatments / Results  Labs (all labs ordered are listed, but only abnormal results are displayed) Labs Reviewed - No data to display  EKG   Radiology No results found.  Procedures Procedures (including critical care time)  Medications Ordered in UC Medications - No data to display  Initial Impression / Assessment and  Plan / UC Course  I have reviewed the triage vital signs and the nursing notes.  Pertinent labs & imaging results that were available during my care of the patient were reviewed by me and considered in my medical decision making (see chart for details).    Likely soft tissue injury.  Patient to use ice packs and ibuprofen.  Final Clinical Impressions(s) / UC Diagnoses   Final diagnoses:  Acute pain of right knee     Discharge Instructions      You can use ibuprofen 800mg  three times a day if needed for pain. You might also try icing it.    ED Prescriptions   None    PDMP not reviewed this encounter.   , NP 09/28/21 2024

## 2021-10-08 ENCOUNTER — Telehealth (HOSPITAL_COMMUNITY): Payer: Self-pay | Admitting: Emergency Medicine

## 2021-10-08 NOTE — Telephone Encounter (Signed)
Was called to patient access area to speak to patient.  Patient requesting FMLA papers to be filled out.  Reviewed medical record .  Spoke to patient stating the urgent care does not complete FMLA papers.  Patient reports this is the second time.  Asked patient if there was a physician group the work place uses for these issues, stated that is the physician that sees you.  Suggested patient go to employee health and wellness.  Rene Kocher , patient access provided patient with information.

## 2021-12-04 ENCOUNTER — Ambulatory Visit: Payer: 59 | Admitting: Critical Care Medicine

## 2021-12-04 NOTE — Progress Notes (Incomplete)
° °  New Patient Office Visit  Subjective:  Patient ID: LAWERNCE DU, male    DOB: 05/16/1991  Age: 31 y.o. MRN: [id number]  CC: No chief complaint on file.   HPI DEONDRAE DEMPEWOLF presents for  Flu hcv No past medical history on file.  No past surgical history on file.  Family History  Problem Relation Age of Onset   Hypertension Mother     Social History   Socioeconomic History   Marital status: Single    Spouse name: Not on file   Number of children: Not on file   Years of education: Not on file   Highest education level: Not on file  Occupational History   Not on file  Tobacco Use   Smoking status: Never   Smokeless tobacco: Never  Substance and Sexual Activity   Alcohol use: Yes    Comment: social   Drug use: No   Sexual activity: Not on file  Other Topics Concern   Not on file  Social History Narrative   Not on file   Social Determinants of Health   Financial Resource Strain: Not on file  Food Insecurity: Not on file  Transportation Needs: Not on file  Physical Activity: Not on file  Stress: Not on file  Social Connections: Not on file  Intimate Partner Violence: Not on file    ROS Review of Systems  Objective:   Today's Vitals: There were no vitals taken for this visit.  Physical Exam  Assessment & Plan:   Problem List Items Addressed This Visit   None   Outpatient Encounter Medications as of 12/04/2021  Medication Sig   [DISCONTINUED] fluticasone (FLONASE) 50 MCG/ACT nasal spray Place 2 sprays into both nostrils daily.   [DISCONTINUED] ipratropium (ATROVENT) 0.06 % nasal spray Place 2 sprays into both nostrils 4 (four) times daily.   No facility-administered encounter medications on file as of 12/04/2021.    Follow-up: No follow-ups on file.   Asencion Noble, MD

## 2021-12-14 ENCOUNTER — Other Ambulatory Visit: Payer: Self-pay

## 2021-12-14 ENCOUNTER — Ambulatory Visit (HOSPITAL_COMMUNITY)
Admission: EM | Admit: 2021-12-14 | Discharge: 2021-12-14 | Disposition: A | Discharge status: Home / Self Care | Payer: 59

## 2021-12-14 ENCOUNTER — Encounter (HOSPITAL_COMMUNITY): Payer: Self-pay | Admitting: Emergency Medicine

## 2021-12-14 DIAGNOSIS — M25561 Pain in right knee: Secondary | ICD-10-CM

## 2021-12-14 NOTE — ED Triage Notes (Signed)
Pt reports felt right knee have pains like when had an injury back in December.

## 2021-12-14 NOTE — ED Provider Notes (Signed)
[facility]    CSN: [id number] Arrival date & time: 12/14/21  1738      History   Chief Complaint Chief Complaint  Patient presents with   Knee Pain    HPI ZAO MASOOD is a 31 y.o. male.   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  He experienced similar knee pain in December which resolved with ice, rest, and NSAIDS.  Denies swelling, bruising, weakness. Pain is worse with ambulation.    History reviewed. No pertinent past medical history.  There are no problems to display for this patient.   History reviewed. No pertinent surgical history.     Home Medications    Prior to Admission medications   Medication Sig Start Date End Date Taking? Authorizing Provider  fluticasone (FLONASE) 50 MCG/ACT nasal spray Place 2 sprays into both nostrils daily. 12/01/18 05/18/20  Tasia Catchings, Amy V, PA-C  ipratropium (ATROVENT) 0.06 % nasal spray Place 2 sprays into both nostrils 4 (four) times daily. 12/01/18 05/18/20  Ok Edwards, PA-C    Family History Family History  Problem Relation Age of Onset   Hypertension Mother     Social History Social History   Tobacco Use   Smoking status: Never   Smokeless tobacco: Never  Substance Use Topics   Alcohol use: Yes    Comment: social   Drug use: No     Allergies   Patient has no known allergies.   Review of Systems Review of Systems  Constitutional:  Negative for chills and fever.  HENT:  Negative for ear pain and sore throat.   Eyes:  Negative for pain and visual disturbance.  Respiratory:  Negative for cough and shortness of breath.   Cardiovascular:  Negative for chest pain and palpitations.  Gastrointestinal:  Negative for abdominal pain and vomiting.  Genitourinary:  Negative for dysuria and hematuria.  Musculoskeletal:  Positive for arthralgias (right knee pain). Negative for back pain.  Skin:  Negative for color change and rash.  Neurological:   Negative for seizures and syncope.  All other systems reviewed and are negative.   Physical Exam Triage Vital Signs ED Triage Vitals  Enc Vitals Group     BP 12/14/21 1816 113/70     Pulse Rate 12/14/21 1816 74     Resp 12/14/21 1816 18     Temp 12/14/21 1816 98.1 F (36.7 C)     Temp Source 12/14/21 1816 Oral     SpO2 12/14/21 1816 97 %     Weight --      Height --      Head Circumference --      Peak Flow --      Pain Score 12/14/21 1814 8     Pain Loc --      Pain Edu? --      Excl. in [place]? --    No data found.  Updated Vital Signs BP 113/70 (BP Location: Right Arm)    Pulse 74    Temp 98.1 F (36.7 C) (Oral)    Resp 18    SpO2 97%   Visual Acuity Right Eye Distance:   Left Eye Distance:   Bilateral Distance:    Right Eye Near:   Left Eye Near:    Bilateral Near:     Physical Exam Vitals and nursing note reviewed.  Constitutional:      General: He is not in  acute distress.    Appearance: He is well-developed.  HENT:     Head: Normocephalic and atraumatic.  Eyes:     Conjunctiva/sclera: Conjunctivae normal.  Cardiovascular:     Rate and Rhythm: Normal rate and regular rhythm.     Heart sounds: No murmur heard. Pulmonary:     Effort: Pulmonary effort is normal. No respiratory distress.     Breath sounds: Normal breath sounds.  Abdominal:     Palpations: Abdomen is soft.     Tenderness: There is no abdominal tenderness.  Musculoskeletal:        General: No swelling.     Cervical back: Neck supple.     Comments: Right knee joint line tenderness, no bruising or swelling noted.  Normal strength to right lower extremity.   Skin:    General: Skin is warm and dry.     Capillary Refill: Capillary refill takes less than 2 seconds.  Neurological:     Mental Status: He is alert.  Psychiatric:        Mood and Affect: Mood normal.     UC Treatments / Results  Labs (all labs ordered are listed, but only abnormal results are displayed) Labs Reviewed - No  data to display  EKG   Radiology No results found.  Procedures Procedures (including critical care time)  Medications Ordered in UC Medications - No data to display  Initial Impression / Assessment and Plan / UC Course  I have reviewed the triage vital signs and the nursing notes.  Pertinent labs & imaging results that were available during my care of the patient were reviewed by me and considered in my medical decision making (see chart for details).     Recurrent right knee pain.  Supportive care discussed.  Work note given.  Advised follow up with orthopedics if no improvement or sx worsen.  Final Clinical Impressions(s) / UC Diagnoses   Final diagnoses:  Acute pain of right knee     Discharge Instructions      Recommend ice, rest, ibuprofen  If no improvement or worsening pain follow up with Orthopedics Contact your HR at work    ED Prescriptions   None    PDMP not reviewed this encounter.   Ward, Lenise Arena, PA-C 12/14/21 [phone]

## 2021-12-14 NOTE — Discharge Instructions (Addendum)
Recommend ice, rest, ibuprofen  If no improvement or worsening pain follow up with Orthopedics Contact your HR at work

## 2021-12-20 ENCOUNTER — Ambulatory Visit: Payer: Self-pay

## 2021-12-20 ENCOUNTER — Other Ambulatory Visit: Payer: Self-pay

## 2021-12-20 ENCOUNTER — Other Ambulatory Visit: Payer: Self-pay | Admitting: Family Medicine

## 2021-12-20 DIAGNOSIS — M25561 Pain in right knee: Secondary | ICD-10-CM

## 2021-12-29 ENCOUNTER — Encounter (HOSPITAL_COMMUNITY): Payer: Self-pay | Admitting: *Deleted

## 2021-12-29 ENCOUNTER — Other Ambulatory Visit: Payer: Self-pay

## 2021-12-29 ENCOUNTER — Ambulatory Visit (HOSPITAL_COMMUNITY)
Admission: EM | Admit: 2021-12-29 | Discharge: 2021-12-29 | Disposition: A | Discharge status: Home / Self Care | Payer: 59 | Attending: Physician Assistant | Admitting: Physician Assistant

## 2021-12-29 DIAGNOSIS — M25561 Pain in right knee: Secondary | ICD-10-CM

## 2021-12-29 MED ORDER — IBUPROFEN 600 MG PO TABS: 600.0000 mg | ORAL_TABLET | Freq: Four times a day (QID) | ORAL | 0 refills | Status: DC | PRN
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
## 2021-12-29 NOTE — Discharge Instructions (Signed)
Follow up with orthopedics ?Rest, ice, ibuprofen as needed ?

## 2022-11-01 ENCOUNTER — Ambulatory Visit
Admission: EM | Admit: 2022-11-01 | Discharge: 2022-11-01 | Disposition: A | Discharge status: Home / Self Care | Payer: 59 | Attending: Urgent Care | Admitting: Urgent Care

## 2022-11-01 DIAGNOSIS — K0889 Other specified disorders of teeth and supporting structures: Secondary | ICD-10-CM

## 2022-11-01 DIAGNOSIS — K047 Periapical abscess without sinus: Secondary | ICD-10-CM

## 2022-11-01 MED ORDER — NAPROXEN 500 MG PO TABS: 500.0000 mg | ORAL_TABLET | Freq: Two times a day (BID) | ORAL | 0 refills | Status: DC

## 2022-11-01 MED ORDER — AMOXICILLIN-POT CLAVULANATE 875-125 MG PO TABS: 1.0000 | ORAL_TABLET | Freq: Two times a day (BID) | ORAL | 0 refills | Status: DC

## 2022-11-01 NOTE — ED Triage Notes (Signed)
Pt is here dental pain x1 day

## 2022-11-01 NOTE — Discharge Instructions (Addendum)
[facility] in [place] Address: [address], [place], [place] [address] Phone: [phone]  [place] [phone] extension [phone] [address].  Dr. Donn Pierini [phone] [place] [phone] [address] [facility].  [facility] [phone] extension [phone] [address] [place], [place], [address] First come first serve for the first 10 clients.  May do simple extractions only, no wisdom teeth or surgery.  You may try the second for Thursday of the month starting at [place] [facility] [employer] You may call the school to see if they are still helping to provide dental care for emergent cases.

## 2022-11-01 NOTE — ED Provider Notes (Signed)
  [facility]  Note:  This document was prepared using Dragon voice recognition software and may include unintentional dictation errors.  MRN: [id number] DOB: 11-29-1990  Subjective:   Daniel English is a 32 y.o. male presenting for 1 day history of acute onset moderate to severe right-sided upper dental pain.  Patient reports that his tooth cracked.  There was no trauma but has a history of poor dentition.  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  He did contact them again to restart work on his teeth but they advised to come here first.  He is also had some facial pain and slight swelling.  No current facility-administered medications for this encounter.  Current Outpatient Medications:    ibuprofen (ADVIL) 600 MG tablet, Take 1 tablet (600 mg total) by mouth every 6 (six) hours as needed., Disp: 30 tablet, Rfl: 0   No Known Allergies  History reviewed. No pertinent past medical history.   History reviewed. No pertinent surgical history.  Family History  Problem Relation Age of Onset   Hypertension Mother     Social History   Tobacco Use   Smoking status: Never   Smokeless tobacco: Never  Substance Use Topics   Alcohol use: Yes    Comment: social   Drug use: No    ROS   Objective:   Vitals: BP 125/80 (BP Location: Left Arm)   Pulse 82   Temp 98 F (36.7 C) (Oral)   Resp 12   SpO2 98%   Physical Exam Constitutional:      General: He is not in acute distress.    Appearance: Normal appearance. He is well-developed and normal weight. He is not ill-appearing, toxic-appearing or diaphoretic.  HENT:     Head: Normocephalic and atraumatic.      Right Ear: External ear normal.     Left Ear: External ear normal.     Nose: Nose normal.     Mouth/Throat:     Dentition: Abnormal dentition. Dental tenderness present.     Pharynx: Oropharynx is clear.   Eyes:     General: No scleral icterus.        Right eye: No discharge.        Left eye: No discharge.     Extraocular Movements: Extraocular movements intact.  Cardiovascular:     Rate and Rhythm: Normal rate.  Pulmonary:     Effort: Pulmonary effort is normal.  Musculoskeletal:     Cervical back: Normal range of motion.  Neurological:     Mental Status: He is alert and oriented to person, place, and time.  Psychiatric:        Mood and Affect: Mood normal.        Behavior: Behavior normal.        Thought Content: Thought content normal.        Judgment: Judgment normal.     Assessment and Plan :   PDMP not reviewed this encounter.  1. Dental infection   2. Pain, dental     Start Augmentin for dental infection/abscess, use naproxen for pain and inflammation. Emphasized need for dental surgeon consult either with a new practice or his old one. Counseled patient on potential for adverse effects with medications prescribed/recommended today, strict ER and return-to-clinic precautions discussed, patient verbalized understanding.    Jaynee Eagles, PA-C 11/01/22 1054

## 2022-12-12 ENCOUNTER — Encounter (HOSPITAL_COMMUNITY): Payer: Self-pay

## 2022-12-12 ENCOUNTER — Other Ambulatory Visit: Payer: Self-pay

## 2022-12-12 ENCOUNTER — Emergency Department (HOSPITAL_COMMUNITY)
Admission: EM | Admit: 2022-12-12 | Discharge: 2022-12-13 | Disposition: A | Discharge status: Home / Self Care | Payer: 59 | Attending: Emergency Medicine | Admitting: Emergency Medicine

## 2022-12-12 DIAGNOSIS — Z79899 Other long term (current) drug therapy: Secondary | ICD-10-CM | POA: Insufficient documentation

## 2022-12-12 DIAGNOSIS — R519 Headache, unspecified: Secondary | ICD-10-CM | POA: Diagnosis not present

## 2022-12-12 DIAGNOSIS — R112 Nausea with vomiting, unspecified: Secondary | ICD-10-CM | POA: Diagnosis present

## 2022-12-12 HISTORY — DX: Unspecified osteoarthritis, unspecified site: M19.90

## 2022-12-12 LAB — CBC WITH DIFFERENTIAL/PLATELET
Abs Immature Granulocytes: 0.01 10*3/uL (ref 0.00–0.07)
Basophils Absolute: 0 10*3/uL (ref 0.0–0.1)
Basophils Relative: 0 %
Eosinophils Absolute: 0 10*3/uL (ref 0.0–0.5)
Eosinophils Relative: 0 %
HCT: 50.2 % (ref 39.0–52.0)
Hemoglobin: 16.5 g/dL (ref 13.0–17.0)
Immature Granulocytes: 0 %
Lymphocytes Relative: 20 %
Lymphs Abs: 1.4 10*3/uL (ref 0.7–4.0)
MCH: 27.7 pg (ref 26.0–34.0)
MCHC: 32.9 g/dL (ref 30.0–36.0)
MCV: 84.4 fL (ref 80.0–100.0)
Monocytes Absolute: 0.4 10*3/uL (ref 0.1–1.0)
Monocytes Relative: 6 %
Neutro Abs: 4.9 10*3/uL (ref 1.7–7.7)
Neutrophils Relative %: 74 %
Platelets: 254 10*3/uL (ref 150–400)
RBC: 5.95 MIL/uL — ABNORMAL HIGH (ref 4.22–5.81)
RDW: 12.5 % (ref 11.5–15.5)
WBC: 6.8 10*3/uL (ref 4.0–10.5)
nRBC: 0 % (ref 0.0–0.2)

## 2022-12-12 LAB — COMPREHENSIVE METABOLIC PANEL
ALT: 34 U/L (ref 0–44)
AST: 28 U/L (ref 15–41)
Albumin: 4.2 g/dL (ref 3.5–5.0)
Alkaline Phosphatase: 46 U/L (ref 38–126)
Anion gap: 7 (ref 5–15)
BUN: 6 mg/dL (ref 6–20)
CO2: 28 mmol/L (ref 22–32)
Calcium: 9.4 mg/dL (ref 8.9–10.3)
Chloride: 101 mmol/L (ref 98–111)
Creatinine, Ser: 0.96 mg/dL (ref 0.61–1.24)
GFR, Estimated: >60 mL/min (ref >60)
Glucose, Bld: 93 mg/dL (ref 70–99)
Potassium: 3.5 mmol/L (ref 3.5–5.1)
Sodium: 136 mmol/L (ref 135–145)
Total Bilirubin: 1.1 mg/dL (ref 0.3–1.2)
Total Protein: 7.9 g/dL (ref 6.5–8.1)

## 2022-12-12 LAB — RAPID URINE DRUG SCREEN, HOSP PERFORMED
Amphetamines: NOT DETECTED
Barbiturates: NOT DETECTED
Benzodiazepines: NOT DETECTED
Cocaine: NOT DETECTED
Opiates: NOT DETECTED
Tetrahydrocannabinol: POSITIVE — AB

## 2022-12-12 LAB — URINALYSIS, ROUTINE W REFLEX MICROSCOPIC
Bilirubin Urine: NEGATIVE
Glucose, UA: NEGATIVE mg/dL
Hgb urine dipstick: NEGATIVE
Ketones, ur: NEGATIVE mg/dL
Leukocytes,Ua: NEGATIVE
Nitrite: NEGATIVE
Protein, ur: NEGATIVE mg/dL
Specific Gravity, Urine: 1.003 — ABNORMAL LOW (ref 1.005–1.030)
pH: 7 (ref 5.0–8.0)

## 2022-12-12 LAB — LIPASE, BLOOD: Lipase: 39 U/L (ref 11–51)

## 2022-12-12 NOTE — ED Triage Notes (Signed)
Pt arrived POV for mid abd pain 2 hours after eating [employer], pizza, and fries. Pt reports his "food didn't digest as good" Pt reports vomited after eating. No hx of GERD. Reports 3 BM earlier, soft, denies diarrhea.

## 2022-12-12 NOTE — ED Provider Triage Note (Signed)
Emergency Medicine Provider Triage Evaluation Note  Daniel English , a 32 y.o. male  was evaluated in triage.  Pt complains of abdominal pain, nausea, vomiting.  Denies fever.  Pain is around the umbilicus.  States he has history of marijuana use but not recently.  Also endorses being sexually active recently with genital rash.  No penile discharge.  Review of Systems  Positive: As above Negative: As above  Physical Exam  BP 123/68   Pulse 69   Temp 98 F (36.7 C)   Resp 16   Ht 6' (1.829 m)   Wt 95.3 kg   SpO2 100%   BMI 28.48 kg/m  Gen:   Awake, no distress   Resp:  Normal effort  MSK:   Moves extremities without difficulty  Other:    Medical Decision Making  Medically screening exam initiated at 9:37 PM.  Appropriate orders placed.  Daniel English was informed that the remainder of the evaluation will be completed by another provider, this initial triage assessment does not replace that evaluation, and the importance of remaining in the ED until their evaluation is complete.     Evlyn Courier, PA-C 12/12/22 2137

## 2022-12-12 NOTE — ED Provider Notes (Signed)
[facility] Provider Note   CSN: [id number] Arrival date & time: 12/12/22  2112     History  Chief Complaint  Patient presents with   Abd Pain  N/V    Daniel English is a 32 y.o. male.  The history is provided by the patient.  Daniel English is a 32 y.o. male who presents to the Emergency Department complaining of nausea and vomiting.  He states that around 3 PM he started feeling unwell after eating and then had an episode of nausea and vomiting with a mild headache.  Overall his headache is resolving but he does have some ongoing nausea.  No diarrhea but did have 3-4 BMs today that is unusual for him.  No fevers, abdominal pain.  No satiated cough or sore throat.  Has no known medical problems takes no medications and has no prior surgeries.        Home Medications Prior to Admission medications   Medication Sig Start Date End Date Taking? Authorizing Provider  ibuprofen (ADVIL) 200 MG tablet Take 200 mg by mouth every 6 (six) hours as needed for mild pain.   Yes [provider]  ondansetron (ZOFRAN-ODT) 4 MG disintegrating tablet Take 1 tablet (4 mg total) by mouth every 8 (eight) hours as needed for nausea or vomiting. 12/13/22  Yes Quintella Reichert, MD  amoxicillin-clavulanate (AUGMENTIN) 875-125 MG tablet Take 1 tablet by mouth 2 (two) times daily. Patient not taking: Reported on 12/12/2022 11/01/22   Jaynee Eagles, PA-C  ibuprofen (ADVIL) 600 MG tablet Take 1 tablet (600 mg total) by mouth every 6 (six) hours as needed. Patient not taking: Reported on 12/12/2022 12/29/21   Ward, Lenise Arena, PA-C  naproxen (NAPROSYN) 500 MG tablet Take 1 tablet (500 mg total) by mouth 2 (two) times daily with a meal. Patient not taking: Reported on 12/12/2022 11/01/22   Jaynee Eagles, PA-C  fluticasone [facility]) 50 MCG/ACT nasal spray Place 2 sprays into both nostrils daily. 12/01/18 05/18/20  Tasia Catchings, Amy V, PA-C  ipratropium (ATROVENT) 0.06 % nasal  spray Place 2 sprays into both nostrils 4 (four) times daily. 12/01/18 05/18/20  Ok Edwards, PA-C      Allergies    Patient has no known allergies.    Review of Systems   Review of Systems  All other systems reviewed and are negative.   Physical Exam Updated Vital Signs BP 126/75 (BP Location: Right Arm)   Pulse (!) 55   Temp 98 F (36.7 C) (Oral)   Resp 16   Ht 6' (1.829 m)   Wt 95.3 kg   SpO2 100%   BMI 28.48 kg/m  Physical Exam Vitals and nursing note reviewed.  Constitutional:      Appearance: He is well-developed.  HENT:     Head: Normocephalic and atraumatic.  Cardiovascular:     Rate and Rhythm: Normal rate and regular rhythm.  Pulmonary:     Effort: Pulmonary effort is normal. No respiratory distress.  Abdominal:     Palpations: Abdomen is soft.     Tenderness: There is no abdominal tenderness. There is no guarding or rebound.  Musculoskeletal:        General: No tenderness.  Skin:    General: Skin is warm and dry.  Neurological:     Mental Status: He is alert and oriented to person, place, and time.     Comments: 5 out of 5 strength in all 4 extremities  Psychiatric:  Behavior: Behavior normal.     ED Results / Procedures / Treatments   Labs (all labs ordered are listed, but only abnormal results are displayed) Labs Reviewed  CBC WITH DIFFERENTIAL/PLATELET - Abnormal; Notable for the following components:      Result Value   RBC 5.95 (*)    All other components within normal limits  URINALYSIS, ROUTINE W REFLEX MICROSCOPIC - Abnormal; Notable for the following components:   Color, Urine STRAW (*)    Specific Gravity, Urine 1.003 (*)    All other components within normal limits  RAPID URINE DRUG SCREEN, HOSP PERFORMED - Abnormal; Notable for the following components:   Tetrahydrocannabinol POSITIVE (*)    All other components within normal limits  COMPREHENSIVE METABOLIC PANEL  LIPASE, BLOOD    EKG None  Radiology No results  found.  Procedures Procedures    Medications Ordered in ED Medications  ondansetron (ZOFRAN-ODT) disintegrating tablet 4 mg (4 mg Oral Given 12/13/22 0010)    ED Course/ Medical Decision Making/ A&P                             Medical Decision Making Amount and/or Complexity of Data Reviewed Labs: ordered.  Risk Prescription drug management.   Patient here for evaluation of nausea and vomiting that started earlier today.  His vomiting has resolved but he does have some ongoing nausea.  He is nontoxic-appearing on evaluation with no focal neurologic deficits.  He has no abdominal tenderness.  CBC, CMP, lipase without acute significant abnormality.  Current clinical picture is not consistent with SBO, pancreatitis, perforated viscus, cholecystitis, appendicitis.  Patient treated with ondansetron in the emergency department.  Discussed with patient home care for nausea and vomiting.  Discussed outpatient follow-up and return precautions.       Final Clinical Impression(s) / ED Diagnoses Final diagnoses:  Nausea and vomiting, unspecified vomiting type    Rx / DC Orders ED Discharge Orders          Ordered    ondansetron (ZOFRAN-ODT) 4 MG disintegrating tablet  Every 8 hours PRN        12/13/22 0004              Quintella Reichert, MD 12/13/22 [phone]

## 2022-12-13 MED ORDER — ONDANSETRON 4 MG PO TBDP
4.0000 mg | ORAL_TABLET | Freq: Once | ORAL | Status: AC
  Administered 2022-12-13: 4 mg via ORAL
  Filled 2022-12-13: qty 1

## 2022-12-13 MED ORDER — ONDANSETRON 4 MG PO TBDP: 4.0000 mg | ORAL_TABLET | Freq: Three times a day (TID) | ORAL | 0 refills | Status: DC | PRN

## 2023-04-14 ENCOUNTER — Ambulatory Visit (HOSPITAL_COMMUNITY)
Admission: EM | Admit: 2023-04-14 | Discharge: 2023-04-14 | Disposition: A | Discharge status: Home / Self Care | Payer: 59 | Attending: Emergency Medicine | Admitting: Emergency Medicine

## 2023-04-14 ENCOUNTER — Encounter (HOSPITAL_COMMUNITY): Payer: Self-pay

## 2023-04-14 DIAGNOSIS — A059 Bacterial foodborne intoxication, unspecified: Secondary | ICD-10-CM

## 2023-04-14 MED ORDER — ONDANSETRON 4 MG PO TBDP: 4.0000 mg | ORAL_TABLET | Freq: Four times a day (QID) | ORAL | 0 refills | Status: AC | PRN

## 2023-04-14 MED ORDER — ONDANSETRON 4 MG PO TBDP
4.0000 mg | ORAL_TABLET | Freq: Once | ORAL | Status: AC
  Administered 2023-04-14: 4 mg via ORAL

## 2023-04-14 MED ORDER — ONDANSETRON 4 MG PO TBDP
ORAL_TABLET | ORAL | Status: AC
  Filled 2023-04-14: qty 1

## 2023-04-14 NOTE — ED Triage Notes (Signed)
Pt is here for vomiting and headache after eating [employer] yesterday. Denies any vomiting today.

## 2023-04-14 NOTE — Discharge Instructions (Addendum)
The nausea medicine (zofran) can be used every 6 hours as needed. Tablet dissolves under the tongue.  Make sure you are increasing fluid intake.  It's ok if you don't have appetite, as long as you are replacing with fluids! Water is best but Pedialyte is good too.  If you are taking the nausea medicine and still vomiting, you need to go to the emergency department.

## 2023-04-14 NOTE — ED Provider Notes (Signed)
[facility]    CSN: [id number] Arrival date & time: 04/14/23  1148     History   Chief Complaint Chief Complaint  Patient presents with   Emesis    HPI Daniel English is a 32 y.o. male.  2 episodes of vomiting about 10 minutes after eating at [facility] last night. Several episodes of loose stool. One further episode of emesis early this morning. Able to sip water since then. Emesis was non bloody, non bilious.  No fever. Denies abdominal pain or cramping  No one he ate with has symptoms, may have had different foods. No recent travel  Symptoms overall improving. Needs note for work.  Past Medical History:  Diagnosis Date   Arthritis     There are no problems to display for this patient.   History reviewed. No pertinent surgical history.     Home Medications    Prior to Admission medications   Medication Sig Start Date End Date Taking? Authorizing Provider  ondansetron (ZOFRAN-ODT) 4 MG disintegrating tablet Take 1 tablet (4 mg total) by mouth every 6 (six) hours as needed for nausea or vomiting. 04/14/23  Yes Delainey Winstanley, PA-C  fluticasone (FLONASE) 50 MCG/ACT nasal spray Place 2 sprays into both nostrils daily. 12/01/18 05/18/20  Cathie Hoops, Amy V, PA-C  ipratropium (ATROVENT) 0.06 % nasal spray Place 2 sprays into both nostrils 4 (four) times daily. 12/01/18 05/18/20  Belinda Fisher, PA-C    Family History Family History  Problem Relation Age of Onset   Hypertension Mother     Social History Social History   Tobacco Use   Smoking status: Never   Smokeless tobacco: Never  Vaping Use   Vaping Use: Never used  Substance Use Topics   Alcohol use: Yes    Comment: social   Drug use: Not Currently     Allergies   Patient has no known allergies.   Review of Systems Review of Systems As per HPI  Physical Exam Triage Vital Signs ED Triage Vitals  Enc Vitals Group     BP 04/14/23 1354 123/72     Pulse Rate 04/14/23 1354 (!) 2     Resp 04/14/23 1354  18     Temp 04/14/23 1354 (!) 97.5 F (36.4 C)     Temp Source 04/14/23 1354 Oral     SpO2 04/14/23 1354 98 %     Weight --      Height --      Head Circumference --      Peak Flow --      Pain Score 04/14/23 1356 4     Pain Loc --      Pain Edu? --      Excl. in GC? --    No data found.  Updated Vital Signs BP 123/72 (BP Location: Left Arm)   Pulse 72   Temp (!) 97.5 F (36.4 C) (Oral)   Resp 18   SpO2 98%   Physical Exam Vitals and nursing note reviewed.  Constitutional:      Appearance: Normal appearance.  HENT:     Mouth/Throat:     Mouth: Mucous membranes are moist.     Pharynx: Oropharynx is clear.  Eyes:     Conjunctiva/sclera: Conjunctivae normal.  Cardiovascular:     Rate and Rhythm: Normal rate and regular rhythm.     Heart sounds: Normal heart sounds.  Pulmonary:     Effort: Pulmonary effort is normal. No respiratory distress.  Breath sounds: Normal breath sounds.  Abdominal:     General: Bowel sounds are normal.     Palpations: Abdomen is soft.     Tenderness: There is abdominal tenderness. There is no guarding or rebound.     Comments: Mild generalized tenderness throughout   Musculoskeletal:        General: Normal range of motion.  Skin:    General: Skin is warm and dry.  Neurological:     Mental Status: He is alert and oriented to person, place, and time.     UC Treatments / Results  Labs (all labs ordered are listed, but only abnormal results are displayed) Labs Reviewed - No data to display  EKG  Radiology No results found.  Procedures Procedures (including critical care time)  Medications Ordered in UC Medications  ondansetron (ZOFRAN-ODT) disintegrating tablet 4 mg (4 mg Oral Given 04/14/23 1440)    Initial Impression / Assessment and Plan / UC Course  I have reviewed the triage vital signs and the nursing notes.  Pertinent labs & imaging results that were available during my care of the patient were reviewed by me and  considered in my medical decision making (see chart for details).  Zofran ODT given in clinic. Able to sip water No red flags. He is improving.  Discussed pushing fluids, bland diet as tolerated, zofran q6 hours prn. Work note provided. Return precautions discussed. Patient agrees to plan  Final Clinical Impressions(s) / UC Diagnoses   Final diagnoses:  Foodborne gastroenteritis     Discharge Instructions      The nausea medicine (zofran) can be used every 6 hours as needed. Tablet dissolves under the tongue.  Make sure you are increasing fluid intake.  It's ok if you don't have appetite, as long as you are replacing with fluids! Water is best but Pedialyte is good too.  If you are taking the nausea medicine and still vomiting, you need to go to the emergency department.     ED Prescriptions     Medication Sig Dispense Auth. Provider   ondansetron (ZOFRAN-ODT) 4 MG disintegrating tablet Take 1 tablet (4 mg total) by mouth every 6 (six) hours as needed for nausea or vomiting. 20 tablet Nycole Kawahara, Lurena Joiner, PA-C      PDMP not reviewed this encounter.   Marlow Baars, [place] 04/14/23 1446

## 2023-06-03 IMAGING — DX DG KNEE COMPLETE 4+V*R*
4 series · 4 of 4 positions shown · non-contrast
Comparison: None.

CLINICAL DATA: 30-year-old male status post twisting injury in
[REDACTED] with persistent pain.

EXAM:
RIGHT KNEE - COMPLETE 4+ VIEW

[knee ap]
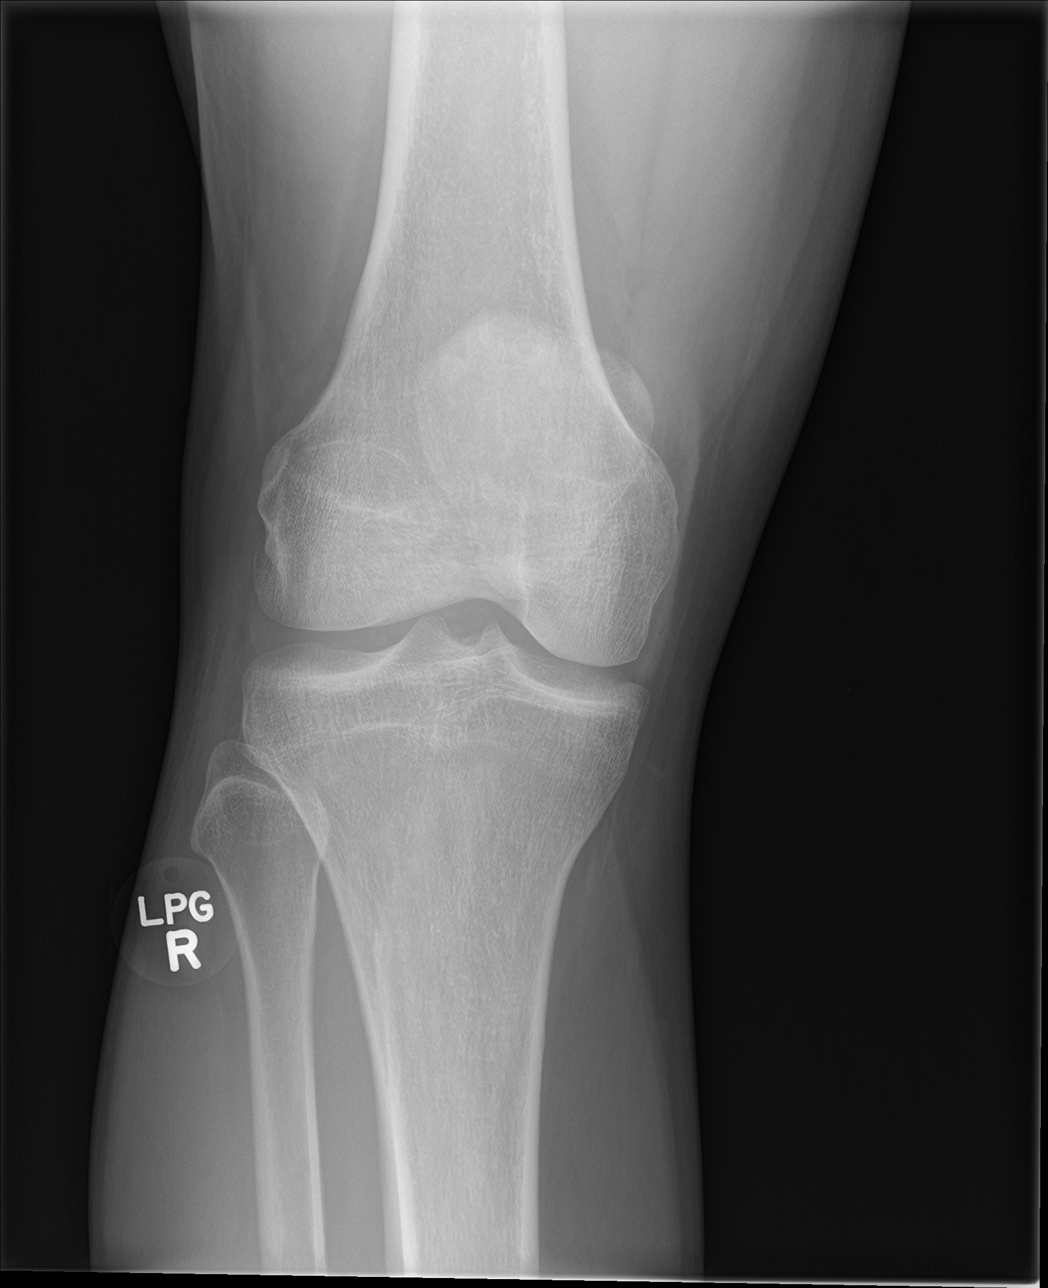

[knee obl (1 of 2)]
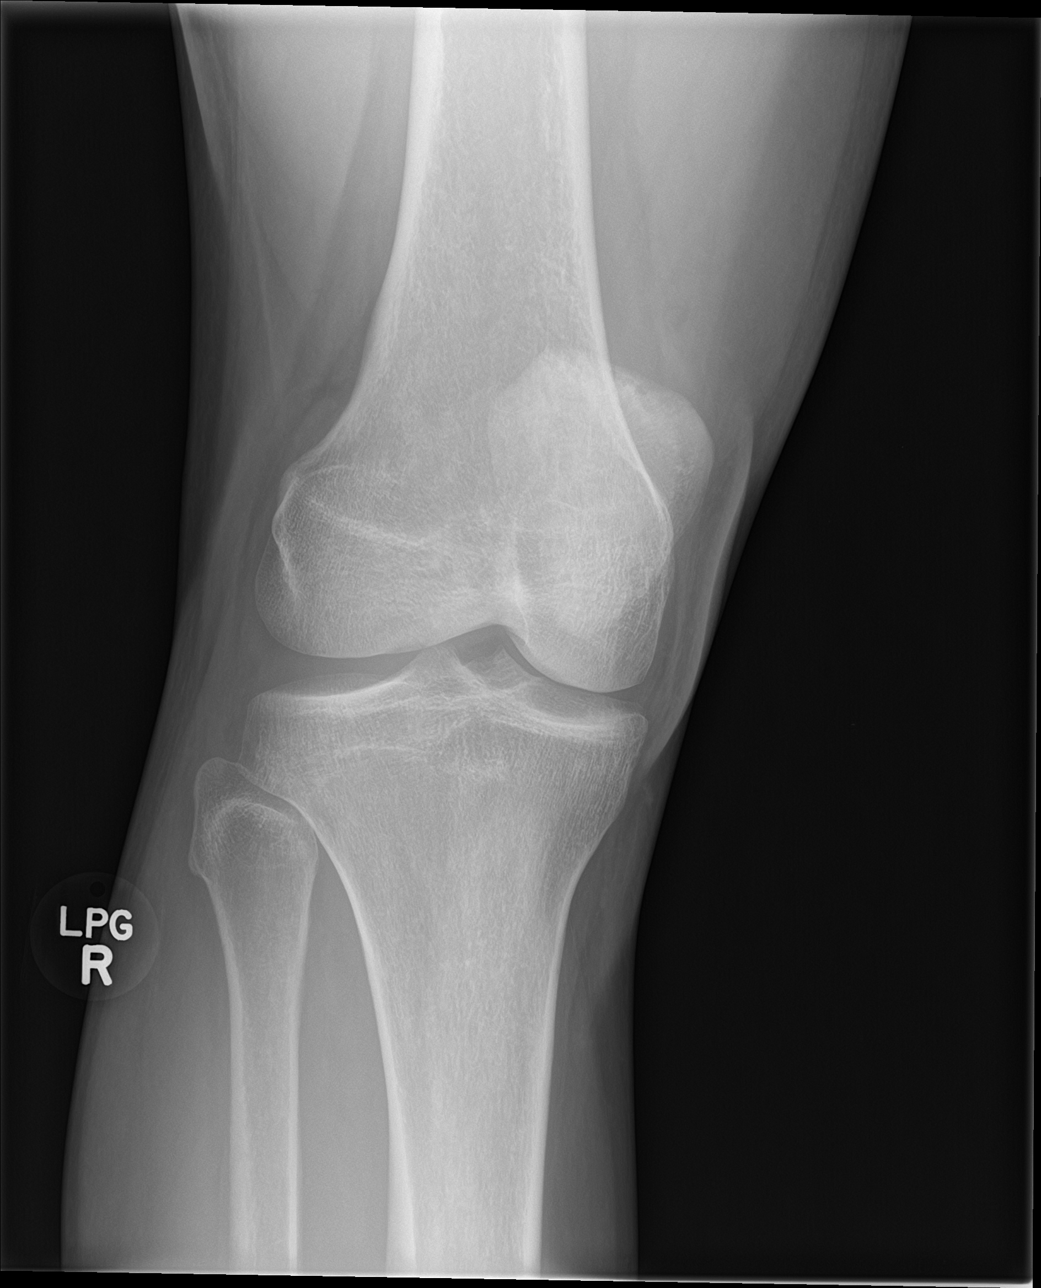

[knee obl (2 of 2)]
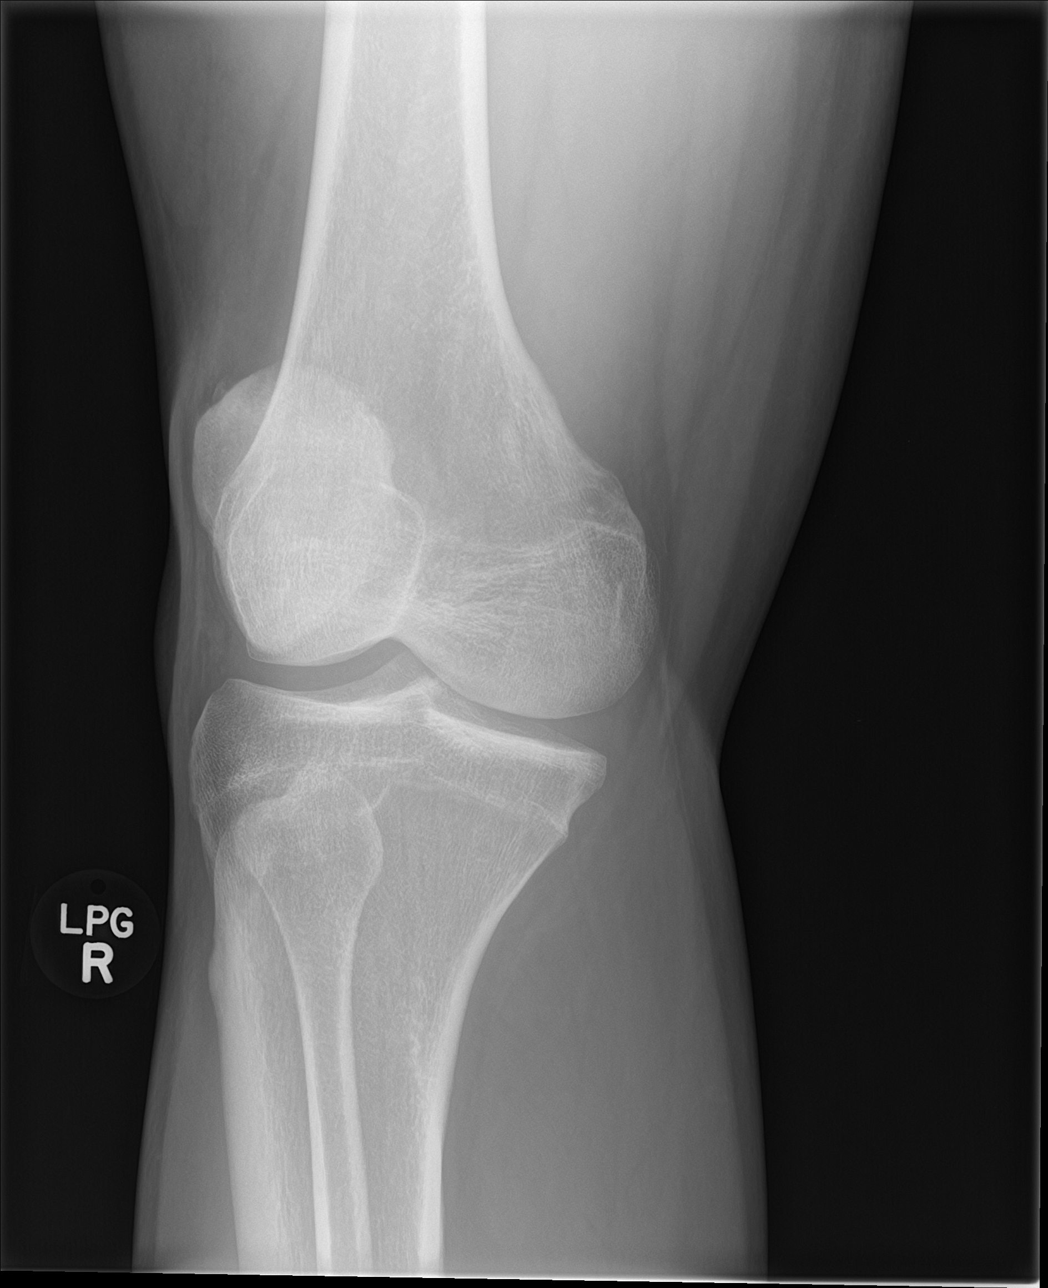

[knee lat]
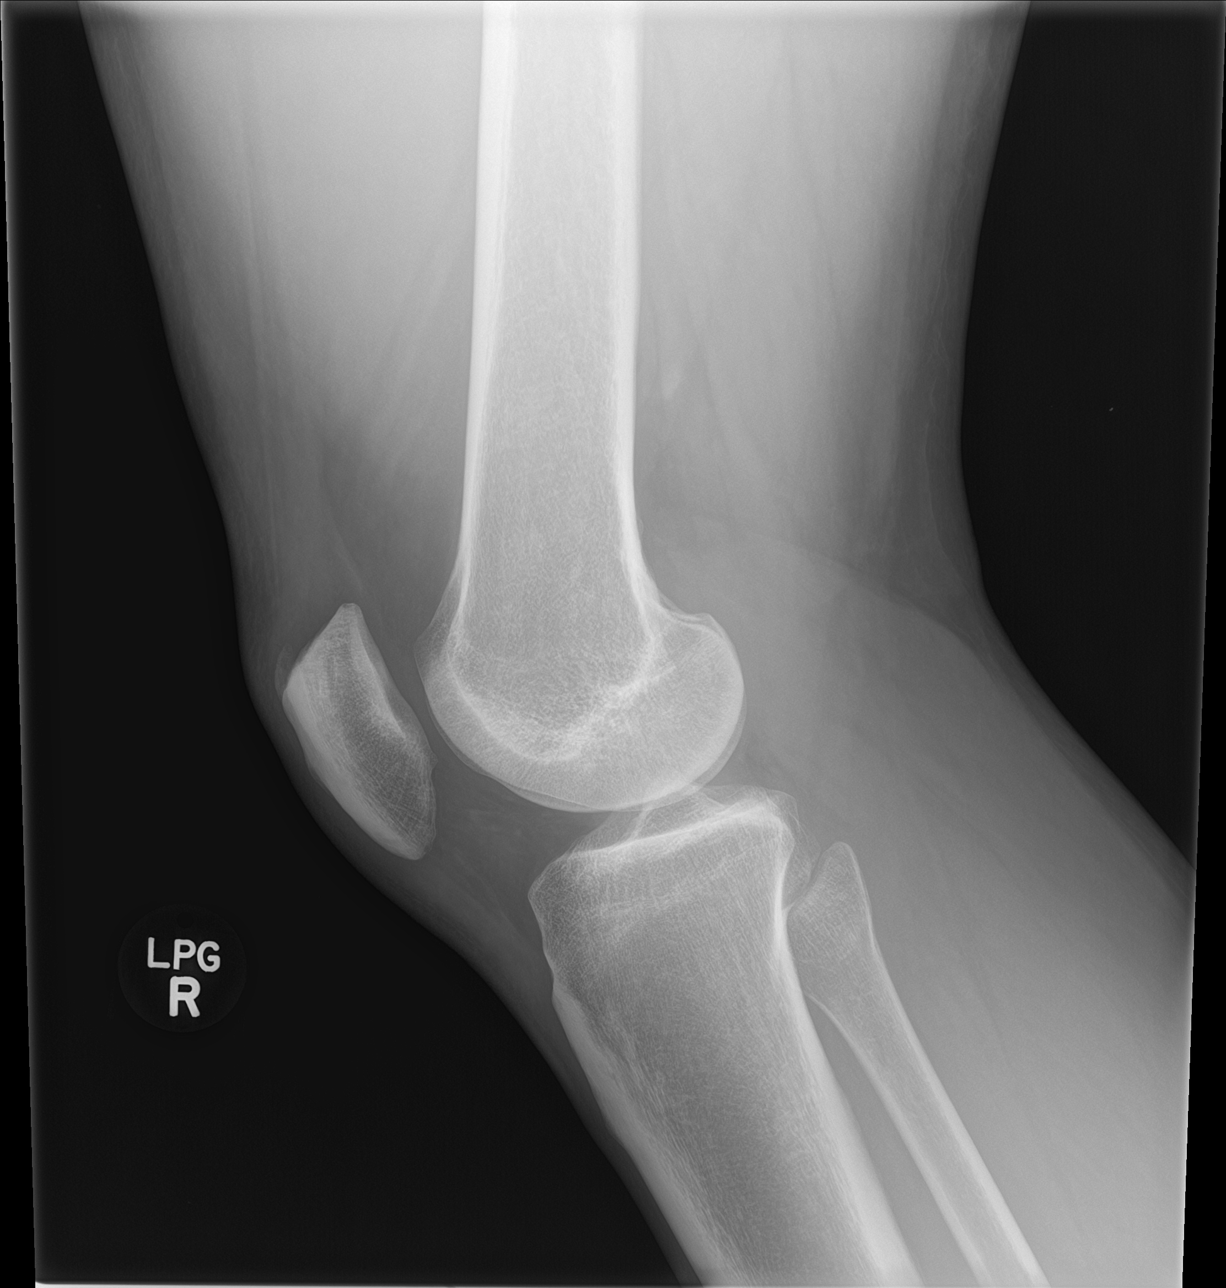

[4 of 4 positions shown; findings below may reference images not displayed]

FINDINGS: Bone mineralization is within normal limits. No evidence of joint
effusion. Normal joint spaces and alignment. Patella intact. No
significant degenerative osseous changes. No acute osseous
abnormality identified. Soft tissue contours within normal limits.
IMPRESSION: Negative.
